# Patient Record
Sex: Female | Born: 1965 | Race: White | Hispanic: No | Marital: Single | Smoking: Former smoker
Health system: Southern US, Community
[De-identification: ages and names within clinical notes are randomized; demographics above are authoritative.]

## PROBLEM LIST (undated history)

## (undated) DIAGNOSIS — I10 Essential (primary) hypertension: Secondary | ICD-10-CM

## (undated) DIAGNOSIS — F32A Depression, unspecified: Secondary | ICD-10-CM

## (undated) DIAGNOSIS — F329 Major depressive disorder, single episode, unspecified: Secondary | ICD-10-CM

## (undated) DIAGNOSIS — E119 Type 2 diabetes mellitus without complications: Secondary | ICD-10-CM

## (undated) HISTORY — PX: BARIATRIC SURGERY: SHX1103

## (undated) HISTORY — PX: KNEE SURGERY: SHX244

## (undated) HISTORY — PX: GASTROPLASTY DUODENAL SWITCH: SHX1699

## (undated) HISTORY — PX: LAPAROSCOPIC BILATERAL SALPINGO OOPHERECTOMY: SHX5890

---

## 2013-07-26 ENCOUNTER — Ambulatory Visit: Payer: BC Managed Care – PPO | Admitting: Physical Therapy

## 2013-08-16 ENCOUNTER — Ambulatory Visit: Payer: BC Managed Care – PPO | Admitting: Physical Therapy

## 2014-08-17 ENCOUNTER — Emergency Department (HOSPITAL_BASED_OUTPATIENT_CLINIC_OR_DEPARTMENT_OTHER)
Admission: EM | Admit: 2014-08-17 | Discharge: 2014-08-17 | Disposition: A | Discharge status: Home / Self Care | Payer: 59 | Attending: Emergency Medicine | Admitting: Emergency Medicine

## 2014-08-17 ENCOUNTER — Emergency Department (HOSPITAL_BASED_OUTPATIENT_CLINIC_OR_DEPARTMENT_OTHER): Payer: 59

## 2014-08-17 ENCOUNTER — Encounter (HOSPITAL_BASED_OUTPATIENT_CLINIC_OR_DEPARTMENT_OTHER): Payer: Self-pay | Admitting: *Deleted

## 2014-08-17 DIAGNOSIS — Y9389 Activity, other specified: Secondary | ICD-10-CM | POA: Diagnosis not present

## 2014-08-17 DIAGNOSIS — Z79899 Other long term (current) drug therapy: Secondary | ICD-10-CM | POA: Diagnosis not present

## 2014-08-17 DIAGNOSIS — I10 Essential (primary) hypertension: Secondary | ICD-10-CM | POA: Diagnosis not present

## 2014-08-17 DIAGNOSIS — F329 Major depressive disorder, single episode, unspecified: Secondary | ICD-10-CM | POA: Insufficient documentation

## 2014-08-17 DIAGNOSIS — Y9289 Other specified places as the place of occurrence of the external cause: Secondary | ICD-10-CM | POA: Diagnosis not present

## 2014-08-17 DIAGNOSIS — S8002XA Contusion of left knee, initial encounter: Secondary | ICD-10-CM | POA: Diagnosis not present

## 2014-08-17 DIAGNOSIS — W010XXA Fall on same level from slipping, tripping and stumbling without subsequent striking against object, initial encounter: Secondary | ICD-10-CM | POA: Diagnosis not present

## 2014-08-17 DIAGNOSIS — Y998 Other external cause status: Secondary | ICD-10-CM | POA: Diagnosis not present

## 2014-08-17 DIAGNOSIS — E119 Type 2 diabetes mellitus without complications: Secondary | ICD-10-CM | POA: Diagnosis not present

## 2014-08-17 DIAGNOSIS — S8992XA Unspecified injury of left lower leg, initial encounter: Secondary | ICD-10-CM | POA: Diagnosis present

## 2014-08-17 HISTORY — DX: Type 2 diabetes mellitus without complications: E11.9

## 2014-08-17 HISTORY — DX: Depression, unspecified: F32.A

## 2014-08-17 HISTORY — DX: Essential (primary) hypertension: I10

## 2014-08-17 HISTORY — DX: Major depressive disorder, single episode, unspecified: F32.9

## 2014-08-17 MED ORDER — TRAMADOL HCL 50 MG PO TABS: 50.0000 mg | ORAL_TABLET | Freq: Four times a day (QID) | ORAL | Status: DC | PRN

## 2014-08-17 NOTE — ED Notes (Signed)
Pt tripped on a toy and landed on her left knee.

## 2014-08-17 NOTE — Discharge Instructions (Signed)
Contusion °A contusion is a deep bruise. Contusions are the result of an injury that caused bleeding under the skin. The contusion may turn blue, purple, or yellow. Minor injuries will give you a painless contusion, but more severe contusions may stay painful and swollen for a few weeks.  °CAUSES  °A contusion is usually caused by a blow, trauma, or direct force to an area of the body. °SYMPTOMS  °· Swelling and redness of the injured area. °· Bruising of the injured area. °· Tenderness and soreness of the injured area. °· Pain. °DIAGNOSIS  °The diagnosis can be made by taking a history and physical exam. An X-ray, CT scan, or MRI may be needed to determine if there were any associated injuries, such as fractures. °TREATMENT  °Specific treatment will depend on what area of the body was injured. In general, the best treatment for a contusion is resting, icing, elevating, and applying cold compresses to the injured area. Over-the-counter medicines may also be recommended for pain control. Ask your caregiver what the best treatment is for your contusion. °HOME CARE INSTRUCTIONS  °· Put ice on the injured area. °¨ Put ice in a plastic bag. °¨ Place a towel between your skin and the bag. °¨ Leave the ice on for 15-20 minutes, 3-4 times a day, or as directed by your health care provider. °· Only take over-the-counter or prescription medicines for pain, discomfort, or fever as directed by your caregiver. Your caregiver may recommend avoiding anti-inflammatory medicines (aspirin, ibuprofen, and naproxen) for 48 hours because these medicines may increase bruising. °· Rest the injured area. °· If possible, elevate the injured area to reduce swelling. °SEEK IMMEDIATE MEDICAL CARE IF:  °· You have increased bruising or swelling. °· You have pain that is getting worse. °· Your swelling or pain is not relieved with medicines. °MAKE SURE YOU:  °· Understand these instructions. °· Will watch your condition. °· Will get help right  away if you are not doing well or get worse. °Document Released: 10/10/2004 Document Revised: 01/05/2013 Document Reviewed: 11/05/2010 °Exit[employer]® Patient Information ©2015 [employer]. This information is not intended to replace advice given to you by your health care provider. Make sure you discuss any questions you have with your health care provider. ° °

## 2014-08-21 NOTE — ED Provider Notes (Signed)
CSN: [id number]     Arrival date & time 08/17/14  1453 History   First MD Initiated Contact with Patient 08/17/14 1459     Chief Complaint  Patient presents with  . Fall     (Consider location/radiation/quality/duration/timing/severity/associated sxs/prior Treatment) HPI   49 year old female with left knee pain. Patient mechanical fall earlier today when she tripped over a toy on the floor. She landed on her left knee. Persistent pain since then. She can ambulate although with increased pain. No numbness or tingling. Denies any significant pain anywhere else. No intervention prior to arrival.  Past Medical History  Diagnosis Date  . Diabetes mellitus without complication   . Hypertension   . Depression    Past Surgical History  Procedure Laterality Date  . Laparoscopic bilateral salpingo oopherectomy     No family history on file. History  Substance Use Topics  . Smoking status: Never Smoker   . Smokeless tobacco: Not on file  . Alcohol Use: Yes   OB History    No data available     Review of Systems  All systems reviewed and negative, other than as noted in HPI.   Allergies  Demerol; Ephedrine; and Zantac  Home Medications   Prior to Admission medications   Medication Sig Start Date End Date Taking? Authorizing Provider  buPROPion (WELLBUTRIN) 100 MG tablet Take 100 mg by mouth 2 (two) times daily.   Yes Historical Provider, MD  doxycycline (DORYX) 100 MG DR capsule Take 100 mg by mouth 2 (two) times daily.   Yes Historical Provider, MD  hydrochlorothiazide (HYDRODIURIL) 25 MG tablet Take 25 mg by mouth daily.   Yes Historical Provider, MD  losartan (COZAAR) 25 MG tablet Take 25 mg by mouth daily.   Yes Historical Provider, MD  metFORMIN (GLUCOPHAGE) 500 MG tablet Take by mouth 2 (two) times daily with a meal.   Yes Historical Provider, MD  omeprazole (PRILOSEC) 40 MG capsule Take 40 mg by mouth daily.   Yes Historical Provider, MD  propranolol (INDERAL) 20 MG  tablet Take 20 mg by mouth 3 (three) times daily.   Yes Historical Provider, MD  venlafaxine (EFFEXOR) 25 MG tablet Take 25 mg by mouth 2 (two) times daily.   Yes Historical Provider, MD   BP 141/63 mmHg  Pulse 70  Temp(Src) 98.4 F (36.9 C) (Oral)  Resp 18  Ht 5\' 9"  (1.753 m)  Wt 290 lb (131.543 kg)  BMI 42.81 kg/m2  SpO2 98% Physical Exam  Constitutional: She appears well-developed and well-nourished. No distress.  HENT:  Head: Normocephalic and atraumatic.  Eyes: Conjunctivae are normal. Right eye exhibits no discharge. Left eye exhibits no discharge.  Neck: Neck supple.  Cardiovascular: Normal rate, regular rhythm and normal heart sounds.  Exam reveals no gallop and no friction rub.   No murmur heard. Pulmonary/Chest: Effort normal and breath sounds normal. No respiratory distress.  Abdominal: Soft. She exhibits no distension. There is no tenderness.  Musculoskeletal: She exhibits no edema or tenderness.  Mild swelling of the left knee. Mild tenderness to palpation over the proximal/lateral tibia/fibula. No effusion. No concerning skin changes. Able to fully range contrast resistance. Neurovascular intact distally. No pain with range of motion of the hip.  Neurological: She is alert.  Skin: Skin is warm and dry.  Psychiatric: She has a normal mood and affect. Her behavior is normal. Thought content normal.  Nursing note and vitals reviewed.   ED Course  Procedures (including critical care time) Labs  Review Labs Reviewed - No data to display  Imaging Review No results found.   EKG Interpretation None      MDM   Final diagnoses:  Knee contusion, left, initial encounter    49 year old female with knee pain after fall. Negative imaging. Plan symptomatic treatment. Closed injury. Neurovascularly intact.    Raeford Razor, MD 08/21/14 [phone]

## 2015-01-05 ENCOUNTER — Emergency Department (HOSPITAL_BASED_OUTPATIENT_CLINIC_OR_DEPARTMENT_OTHER)
Admission: EM | Admit: 2015-01-05 | Discharge: 2015-01-05 | Disposition: A | Discharge status: Home / Self Care | Payer: 59 | Attending: Emergency Medicine | Admitting: Emergency Medicine

## 2015-01-05 ENCOUNTER — Encounter (HOSPITAL_BASED_OUTPATIENT_CLINIC_OR_DEPARTMENT_OTHER): Payer: Self-pay

## 2015-01-05 DIAGNOSIS — Z79899 Other long term (current) drug therapy: Secondary | ICD-10-CM | POA: Diagnosis not present

## 2015-01-05 DIAGNOSIS — I10 Essential (primary) hypertension: Secondary | ICD-10-CM | POA: Insufficient documentation

## 2015-01-05 DIAGNOSIS — Z792 Long term (current) use of antibiotics: Secondary | ICD-10-CM | POA: Insufficient documentation

## 2015-01-05 DIAGNOSIS — E119 Type 2 diabetes mellitus without complications: Secondary | ICD-10-CM | POA: Diagnosis not present

## 2015-01-05 DIAGNOSIS — Z7984 Long term (current) use of oral hypoglycemic drugs: Secondary | ICD-10-CM | POA: Insufficient documentation

## 2015-01-05 DIAGNOSIS — F329 Major depressive disorder, single episode, unspecified: Secondary | ICD-10-CM | POA: Diagnosis not present

## 2015-01-05 DIAGNOSIS — M25561 Pain in right knee: Secondary | ICD-10-CM | POA: Diagnosis present

## 2015-01-05 NOTE — ED Provider Notes (Signed)
CSN: [id number]     Arrival date & time 01/05/15  2037 History  By signing my name below, I, Gonzella Lex, attest that this documentation has been prepared under the direction and in the presence of Paula Libra, MD. Electronically Signed: Gonzella Lex, Scribe. 01/05/2015. 11:13 PM.    Chief Complaint  Patient presents with  . Knee Pain    The history is provided by the patient. No language interpreter was used.    HPI Comments: Susan Johns is a 49 y.o. female who presents to the Emergency Department complaining of right knee pain onset three days ago. There was no precipitating trauma. Pt reports difficulty with ambulation today and went to see Dr. Thurston Hole who administered a cortisone injection and prescribed her tramadol and meloxicam. About an hour after the injection she developed severe pain in the right knee. She localizes the pain to the entire knee. Pain is worse with flexion or weightbearing. She's got no relief from the tramadol and meloxicam. She reports associated swelling of the right knee.    Past Medical History  Diagnosis Date  . Diabetes mellitus without complication (HCC)   . Hypertension   . Depression    Past Surgical History  Procedure Laterality Date  . Laparoscopic bilateral salpingo oopherectomy     No family history on file. Social History  Substance Use Topics  . Smoking status: Never Smoker   . Smokeless tobacco: None  . Alcohol Use: Yes   OB History    No data available     Review of Systems A complete 10 system review of systems was obtained and all systems are negative except as noted in the HPI and PMH.   Allergies  Demerol; Ephedrine; Hydrocodone; Oxycodone; and Zantac  Home Medications   Prior to Admission medications   Medication Sig Start Date End Date Taking? Authorizing Provider  buPROPion (WELLBUTRIN) 100 MG tablet Take 100 mg by mouth 2 (two) times daily.    Historical Provider, MD  doxycycline (DORYX) 100 MG DR  capsule Take 100 mg by mouth 2 (two) times daily.    Historical Provider, MD  hydrochlorothiazide (HYDRODIURIL) 25 MG tablet Take 25 mg by mouth daily.    Historical Provider, MD  losartan (COZAAR) 25 MG tablet Take 25 mg by mouth daily.    Historical Provider, MD  metFORMIN (GLUCOPHAGE) 500 MG tablet Take by mouth 2 (two) times daily with a meal.    Historical Provider, MD  omeprazole (PRILOSEC) 40 MG capsule Take 40 mg by mouth daily.    Historical Provider, MD  propranolol (INDERAL) 20 MG tablet Take 20 mg by mouth 3 (three) times daily.    Historical Provider, MD  venlafaxine (EFFEXOR) 25 MG tablet Take 25 mg by mouth 2 (two) times daily.    Historical Provider, MD   BP 169/87 mmHg  Pulse 78  Temp(Src) 99.5 F (37.5 C) (Oral)  Resp 18  Ht  (1.753 m)  Wt 302 lb (136.986 kg)  BMI 44.58 kg/m2  SpO2 97%   Physical Exam General: Well-developed, well-nourished female in no acute distress; appearance consistent with age of record HENT: normocephalic; atraumatic Eyes: pupils equal, round and reactive to light; extraocular muscles intact Neck: supple Heart: regular rate and rhythm Lungs: clear to auscultation bilaterally Abdomen: soft; nondistended Extremities: No deformity; full range of motion except right knee limited by pain; pulses normal; mild tenderness of medial right knee with severe pain on flexion, small effusion noted Neurologic: Awake, alert and  oriented; motor function intact in all extremities and symmetric; no facial droop Skin: Warm and dry Psychiatric: Normal mood and affect   ED Course  Procedures  DIAGNOSTIC STUDIES:    Oxygen Saturation is 97% on RA, adequate by my interpretation.   COORDINATION OF CARE:  10:59 PM Will discharge pt with immobilizer and crutches. Discussed treatment plan with pt at bedside and pt agreed to plan.   MDM   Final diagnoses:  Acute knee pain, right   I personally performed the services described in this documentation,  which was scribed in my presence. The recorded information has been reviewed and is accurate.    Paula LibraJohn Sayler Mickiewicz, MD 01/05/15 [phone]

## 2015-01-05 NOTE — ED Notes (Signed)
Pt c/o right knee pain without injury, saw ortho today and had xrays which were negative, given cortisone shot, tramadol and meloxicam and states the pain got much worse on the way home.  Slight swelling to knee, was given knee sleeve as well but pt states that it is not helping.  Pt walking with aid of a walker.

## 2015-04-13 ENCOUNTER — Encounter (HOSPITAL_COMMUNITY): Payer: Self-pay

## 2015-04-13 ENCOUNTER — Other Ambulatory Visit (HOSPITAL_COMMUNITY): Payer: Self-pay | Admitting: Orthopedic Surgery

## 2015-04-13 DIAGNOSIS — M25561 Pain in right knee: Principal | ICD-10-CM

## 2015-04-13 DIAGNOSIS — M25461 Effusion, right knee: Secondary | ICD-10-CM

## 2015-04-14 ENCOUNTER — Ambulatory Visit (HOSPITAL_COMMUNITY)
Admission: RE | Admit: 2015-04-14 | Discharge: 2015-04-14 | Disposition: A | Discharge status: Home / Self Care | Payer: BLUE CROSS/BLUE SHIELD | Source: Ambulatory Visit | Attending: Cardiology | Admitting: Cardiology

## 2015-04-14 DIAGNOSIS — F329 Major depressive disorder, single episode, unspecified: Secondary | ICD-10-CM | POA: Insufficient documentation

## 2015-04-14 DIAGNOSIS — I10 Essential (primary) hypertension: Secondary | ICD-10-CM | POA: Insufficient documentation

## 2015-04-14 DIAGNOSIS — M25461 Effusion, right knee: Secondary | ICD-10-CM | POA: Insufficient documentation

## 2015-04-14 DIAGNOSIS — M25561 Pain in right knee: Secondary | ICD-10-CM | POA: Diagnosis not present

## 2015-04-14 DIAGNOSIS — E119 Type 2 diabetes mellitus without complications: Secondary | ICD-10-CM | POA: Insufficient documentation

## 2015-04-17 ENCOUNTER — Encounter (HOSPITAL_COMMUNITY): Payer: Self-pay

## 2015-12-01 ENCOUNTER — Emergency Department (HOSPITAL_BASED_OUTPATIENT_CLINIC_OR_DEPARTMENT_OTHER)
Admission: EM | Admit: 2015-12-01 | Discharge: 2015-12-01 | Disposition: A | Discharge status: Home / Self Care | Payer: BLUE CROSS/BLUE SHIELD | Attending: Emergency Medicine | Admitting: Emergency Medicine

## 2015-12-01 ENCOUNTER — Encounter (HOSPITAL_BASED_OUTPATIENT_CLINIC_OR_DEPARTMENT_OTHER): Payer: Self-pay | Admitting: *Deleted

## 2015-12-01 DIAGNOSIS — I10 Essential (primary) hypertension: Secondary | ICD-10-CM | POA: Insufficient documentation

## 2015-12-01 DIAGNOSIS — Y9389 Activity, other specified: Secondary | ICD-10-CM | POA: Insufficient documentation

## 2015-12-01 DIAGNOSIS — Z79899 Other long term (current) drug therapy: Secondary | ICD-10-CM | POA: Insufficient documentation

## 2015-12-01 DIAGNOSIS — Y929 Unspecified place or not applicable: Secondary | ICD-10-CM | POA: Insufficient documentation

## 2015-12-01 DIAGNOSIS — Z7984 Long term (current) use of oral hypoglycemic drugs: Secondary | ICD-10-CM | POA: Insufficient documentation

## 2015-12-01 DIAGNOSIS — S61210A Laceration without foreign body of right index finger without damage to nail, initial encounter: Secondary | ICD-10-CM | POA: Insufficient documentation

## 2015-12-01 DIAGNOSIS — Y999 Unspecified external cause status: Secondary | ICD-10-CM | POA: Insufficient documentation

## 2015-12-01 DIAGNOSIS — E119 Type 2 diabetes mellitus without complications: Secondary | ICD-10-CM | POA: Insufficient documentation

## 2015-12-01 DIAGNOSIS — W268XXA Contact with other sharp object(s), not elsewhere classified, initial encounter: Secondary | ICD-10-CM | POA: Insufficient documentation

## 2015-12-01 MED ORDER — CEPHALEXIN 500 MG PO CAPS: 500.0000 mg | ORAL_CAPSULE | Freq: Four times a day (QID) | ORAL | 0 refills | Status: DC

## 2015-12-01 MED ORDER — LIDOCAINE HCL (PF) 1 % IJ SOLN
5.0000 mL | Freq: Once | INTRAMUSCULAR | Status: AC
  Administered 2015-12-01: 5 mL via INTRADERMAL
  Filled 2015-12-01: qty 5

## 2015-12-01 NOTE — ED Provider Notes (Signed)
[facility] Provider Note   CSN: [id number] Arrival date & time: 12/01/15  1935   By signing my name below, I, Soijett Blue, attest that this documentation has been prepared under the direction and in the presence of Dierdre Forth, PA-C Electronically Signed: Soijett Blue, ED Scribe. 12/01/15. 9:06 PM.  History   Chief Complaint Chief Complaint  Patient presents with  . Laceration    HPI Susan Johns is a 50 y.o. female with a PMHx of DM, HTN, who presents to the Emergency Department complaining of right index finger laceration onset PTA. She reports that she cut her right index finger on a metal can lid while taking out the trash. Pt notes that she is UTD with her tetanus vaccination with her last being less than 5 years ago. Pt reports that she is having associated symptoms of mild right index pain. She states that she has tried applying pressure without medications for the relief for her symptoms. She denies numbness, tingling, color change, swelling, and any other symptoms. Pt denies taking insulin for her DM. Pt notes that she has a PCP. 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     The history is provided by the patient. No language interpreter was used.    Past Medical History:  Diagnosis Date  . Depression   . Diabetes mellitus without complication (HCC)   . Hypertension     There are no active problems to display for this patient.   Past Surgical History:  Procedure Laterality Date  . KNEE SURGERY    . LAPAROSCOPIC BILATERAL SALPINGO OOPHERECTOMY      OB History    No data available       Home Medications    Prior to Admission medications   Medication Sig Start Date End Date Taking? Authorizing Provider  GLIPIZIDE PO Take by mouth.   Yes Historical Provider, MD  losartan (COZAAR) 25 MG tablet Take 25 mg by mouth daily.   Yes Historical Provider, MD  omeprazole (PRILOSEC) 40 MG capsule Take 40 mg by mouth  daily.   Yes Historical Provider, MD  Pioglitazone HCl (ACTOS PO) Take by mouth.   Yes Historical Provider, MD  propranolol (INDERAL) 20 MG tablet Take 20 mg by mouth 3 (three) times daily.   Yes Historical Provider, MD  venlafaxine (EFFEXOR) 25 MG tablet Take 25 mg by mouth 2 (two) times daily.   Yes Historical Provider, MD  buPROPion (WELLBUTRIN) 100 MG tablet Take 100 mg by mouth 2 (two) times daily.    Historical Provider, MD  cephALEXin (KEFLEX) 500 MG capsule Take 1 capsule (500 mg total) by mouth 4 (four) times daily. 12/01/15   Quinlan Vollmer, PA-C  doxycycline (DORYX) 100 MG DR capsule Take 100 mg by mouth 2 (two) times daily.    Historical Provider, MD  hydrochlorothiazide (HYDRODIURIL) 25 MG tablet Take 25 mg by mouth daily.    Historical Provider, MD  metFORMIN (GLUCOPHAGE) 500 MG tablet Take by mouth 2 (two) times daily with a meal.    Historical Provider, MD    Family History No family history on file.  Social History Social History  Substance Use Topics  . Smoking status: Never Smoker  . Smokeless tobacco: Never Used  . Alcohol use Yes     Allergies   Demerol [meperidine]; Ephedrine; Hydrocodone; Oxycodone; and Zantac [ranitidine hcl]   Review of Systems Review of Systems  Musculoskeletal: Positive for arthralgias (right index finger). Negative  for joint swelling.  Skin: Positive for wound (laceration to right index finger). Negative for color change.  Neurological: Negative for numbness.       No tingling  All other systems reviewed and are negative.    Physical Exam Updated Vital Signs BP 132/81 (BP Location: Left Arm)   Pulse 84   Temp 98.5 F (36.9 C) (Oral)   Resp 18   Ht 5\' 9"  (1.753 m)   Wt (!) 305 lb (138.3 kg)   SpO2 96%   BMI 45.04 kg/m   Physical Exam  Constitutional: She is oriented to person, place, and time. She appears well-developed and well-nourished. No distress.  HENT:  Head: Normocephalic and atraumatic.  Eyes: Conjunctivae  are normal. No scleral icterus.  Neck: Normal range of motion.  Cardiovascular: Normal rate, regular rhythm, normal heart sounds and intact distal pulses.   No murmur heard. Capillary refill < 3 sec  Pulmonary/Chest: Effort normal and breath sounds normal. No respiratory distress.  Musculoskeletal: Normal range of motion. She exhibits no edema.  ROM: full ROM of the right pointer finger with 1.7 cm laceration that doesn't involve the cuticle or nail bed.   Neurological: She is alert and oriented to person, place, and time. She has normal strength. No sensory deficit.  Sensation: intact in right pointer finger Strength: 5/5 with flexion and extension of right pointer finger  Skin: Skin is warm and dry. She is not diaphoretic.  Psychiatric: She has a normal mood and affect.  Nursing note and vitals reviewed.    ED Treatments / Results  DIAGNOSTIC STUDIES: Oxygen Saturation is 96% on RA, nl by my interpretation.    COORDINATION OF CARE: 9:05 PM Discussed treatment plan with pt at bedside which includes laceration repair, follow up in 5-7 days with PCP for suture removal, and abx Rx, and pt agreed to plan.   Procedures .Marland Kitchen.Laceration Repair Date/Time: 12/01/2015 8:46 PM Performed by: Dierdre ForthMUTHERSBAUGH, Toba Claudio Authorized by: Dierdre ForthMUTHERSBAUGH, Uchechukwu Dhawan   Consent:    Consent obtained:  Verbal   Consent given by:  Patient   Risks discussed:  Pain and need for additional repair Anesthesia (see MAR for exact dosages):    Anesthesia method:  Local infiltration   Local anesthetic:  Lidocaine 1% w/o epi (2.5 cc used) Laceration details:    Location:  Finger   Finger location:  R index finger   Length (cm):  1.7 Repair type:    Repair type:  Simple Pre-procedure details:    Preparation:  Patient was prepped and draped in usual sterile fashion Exploration:    Hemostasis achieved with:  Direct pressure   Wound exploration: entire depth of wound probed and visualized   Treatment:    Area cleansed  with:  Betadine and saline   Amount of cleaning:  Standard   Irrigation solution:  Sterile saline   Irrigation method:  Pressure wash   Visualized foreign bodies/material removed: no   Skin repair:    Repair method:  Sutures   Suture size:  5-0   Suture material:  Prolene   Suture technique:  Simple interrupted   Number of sutures:  3 Approximation:    Approximation:  Close Post-procedure details:    Dressing:  Sterile dressing   Patient tolerance of procedure:  Tolerated well, no immediate complications     (including critical care time)  Medications Ordered in ED Medications  lidocaine (PF) (XYLOCAINE) 1 % injection 5 mL (5 mLs Intradermal Given 12/01/15 2135)     Initial  Impression / Assessment and Plan / ED Course  I have reviewed the triage vital signs and the nursing notes.   Clinical Course     Pressure irrigation performed. Wound explored and base of wound visualized in a bloodless field without evidence of foreign body.  Laceration occurred < 8 hours prior to repair which was well tolerated. Tdap up to date.  Pt has comorbidities to effect normal wound healing. Pt discharged with antibiotics.  Discussed suture home care with patient and answered questions. Pt to follow-up for wound check and suture removal in 7 days; they are to return to the ED sooner for signs of infection. Pt is hemodynamically stable with no complaints prior to dc.    Final Clinical Impressions(s) / ED Diagnoses   Final diagnoses:  Laceration of right index finger without foreign body without damage to nail, initial encounter    New Prescriptions Discharge Medication List as of 12/01/2015  9:17 PM    START taking these medications   Details  cephALEXin (KEFLEX) 500 MG capsule Take 1 capsule (500 mg total) by mouth 4 (four) times daily., Starting Fri 12/01/2015, Print       I personally performed the services described in this documentation, which was scribed in my presence. The  recorded information has been reviewed and is accurate.     Dahlia ClientHannah Rayan Dyal, PA-C 12/01/15 [phone]    Rolan BuccoMelanie Belfi, MD 12/01/15 [phone]

## 2015-12-01 NOTE — Discharge Instructions (Signed)

## 2015-12-01 NOTE — ED Triage Notes (Signed)
Laceration to her right 2nd finger on a metal can lid.

## 2017-01-02 ENCOUNTER — Encounter (HOSPITAL_BASED_OUTPATIENT_CLINIC_OR_DEPARTMENT_OTHER): Payer: Self-pay

## 2017-01-02 ENCOUNTER — Emergency Department (HOSPITAL_BASED_OUTPATIENT_CLINIC_OR_DEPARTMENT_OTHER): Payer: BLUE CROSS/BLUE SHIELD

## 2017-01-02 ENCOUNTER — Other Ambulatory Visit: Payer: Self-pay

## 2017-01-02 ENCOUNTER — Emergency Department (HOSPITAL_BASED_OUTPATIENT_CLINIC_OR_DEPARTMENT_OTHER)
Admission: EM | Admit: 2017-01-02 | Discharge: 2017-01-02 | Disposition: A | Discharge status: Home / Self Care | Payer: BLUE CROSS/BLUE SHIELD | Attending: Emergency Medicine | Admitting: Emergency Medicine

## 2017-01-02 DIAGNOSIS — W010XXA Fall on same level from slipping, tripping and stumbling without subsequent striking against object, initial encounter: Secondary | ICD-10-CM | POA: Insufficient documentation

## 2017-01-02 DIAGNOSIS — I10 Essential (primary) hypertension: Secondary | ICD-10-CM | POA: Insufficient documentation

## 2017-01-02 DIAGNOSIS — Y999 Unspecified external cause status: Secondary | ICD-10-CM | POA: Diagnosis not present

## 2017-01-02 DIAGNOSIS — Z87891 Personal history of nicotine dependence: Secondary | ICD-10-CM | POA: Diagnosis not present

## 2017-01-02 DIAGNOSIS — Y92009 Unspecified place in unspecified non-institutional (private) residence as the place of occurrence of the external cause: Secondary | ICD-10-CM | POA: Insufficient documentation

## 2017-01-02 DIAGNOSIS — S59901A Unspecified injury of right elbow, initial encounter: Secondary | ICD-10-CM | POA: Diagnosis present

## 2017-01-02 DIAGNOSIS — E119 Type 2 diabetes mellitus without complications: Secondary | ICD-10-CM | POA: Insufficient documentation

## 2017-01-02 DIAGNOSIS — Z79899 Other long term (current) drug therapy: Secondary | ICD-10-CM | POA: Insufficient documentation

## 2017-01-02 DIAGNOSIS — S52134A Nondisplaced fracture of neck of right radius, initial encounter for closed fracture: Secondary | ICD-10-CM | POA: Diagnosis not present

## 2017-01-02 DIAGNOSIS — Y9301 Activity, walking, marching and hiking: Secondary | ICD-10-CM | POA: Diagnosis not present

## 2017-01-02 DIAGNOSIS — W19XXXA Unspecified fall, initial encounter: Secondary | ICD-10-CM

## 2017-01-02 NOTE — ED Triage Notes (Signed)
Pt states she slipped/fell approx 930pm-pain to right elbow-minor pain to right hip and UE-NAD-steady gait

## 2017-01-02 NOTE — ED Provider Notes (Signed)
[facility] EMERGENCY DEPARTMENT Provider Note   CSN: [id number] Arrival date & time: 01/02/17  1106     History   Chief Complaint Chief Complaint  Patient presents with  . Fall    HPI Susan Johns is a 51 y.o. female.  The history is provided by the patient. No language interpreter was used.  Fall     Susan Johns is a 51 y.o. female who presents to the Emergency Department complaining of fall.  Urgency department for evaluation of injuries following a mechanical fall that happened earlier today.  She was walking at home when she tripped over a rug and slid forward, landing on her right side with her right elbow underneath her.  She reports pain to her right elbow that radiates to her wrist with movement.  She is right-hand dominant.  She did have gastric surgery 3 weeks ago.  No abdominal pain, chest pain.  Past Medical History:  Diagnosis Date  . Depression   . Diabetes mellitus without complication (HCC)   . Hypertension     There are no active problems to display for this patient.   Past Surgical History:  Procedure Laterality Date  . BARIATRIC SURGERY    . GASTROPLASTY DUODENAL SWITCH    . KNEE SURGERY    . LAPAROSCOPIC BILATERAL SALPINGO OOPHERECTOMY      OB History    No data available       Home Medications    Prior to Admission medications   Medication Sig Start Date End Date Taking? Authorizing Provider  losartan (COZAAR) 25 MG tablet Take 25 mg by mouth daily.    [provider]  omeprazole (PRILOSEC) 40 MG capsule Take 40 mg by mouth daily.    [provider]  propranolol (INDERAL) 20 MG tablet Take 20 mg by mouth 3 (three) times daily.    [provider]  venlafaxine (EFFEXOR) 25 MG tablet Take 25 mg by mouth 2 (two) times daily.    [provider]    Family History No family history on file.  Social History Social History   Tobacco Use  . Smoking status: Former Games developermoker  . Smokeless tobacco:  Never Used  Substance Use Topics  . Alcohol use: No    Frequency: Never  . Drug use: No     Allergies   Demerol [meperidine]; Ephedrine; Hydrocodone; Oxycodone; and Zantac [ranitidine hcl]   Review of Systems Review of Systems  All other systems reviewed and are negative.    Physical Exam Updated Vital Signs BP (!) 113/53 (BP Location: Left Arm)   Pulse 65   Temp 98.9 F (37.2 C) (Oral)   Resp 18   Ht 5\' 8"  (1.727 m)   Wt 117.9 kg (260 lb)   SpO2 100%   BMI 39.53 kg/m   Physical Exam  Constitutional: She is oriented to person, place, and time. She appears well-developed and well-nourished.  HENT:  Head: Normocephalic and atraumatic.  Cardiovascular: Normal rate and regular rhythm.  No murmur heard. Pulmonary/Chest: Effort normal and breath sounds normal. No respiratory distress.  Abdominal: Soft. There is no tenderness. There is no rebound and no guarding.  Musculoskeletal:  2+ radial pulses bilaterally.  There is tenderness to palpation throughout the right elbow with no palpable deformity.  5 out of 5 grip strength bilaterally.  Sensation to light touch intact throughout the digits of the hand.  Neurological: She is alert and oriented to person, place, and time.  Skin: Skin is  warm and dry.  Psychiatric: She has a normal mood and affect. Her behavior is normal.  Nursing note and vitals reviewed.    ED Treatments / Results  Labs (all labs ordered are listed, but only abnormal results are displayed) Labs Reviewed - No data to display  EKG  EKG Interpretation None       Radiology Dg Elbow Complete Right  Result Date: 01/02/2017 CLINICAL DATA:  Right elbow injury due to a fall today. Pain. Initial encounter. EXAM: RIGHT ELBOW - COMPLETE 3+ VIEW COMPARISON:  None. FINDINGS: The patient has a nondisplaced fracture of the right radial neck. No other fracture is identified. Associated joint effusion is noted. The elbow is located. Mild spurring off the medial  epicondyle incidentally noted. IMPRESSION: Nondisplaced fracture of the radial neck with associated joint effusion. Electronically Signed   By: Drusilla Kannerhomas  Dalessio M.D.   On: 01/02/2017 12:13    Procedures Procedures (including critical care time)  Medications Ordered in ED Medications - No data to display   Initial Impression / Assessment and Plan / ED Course  I have reviewed the triage vital signs and the nursing notes.  Pertinent labs & imaging results that were available during my care of the patient were reviewed by me and considered in my medical decision making (see chart for details).     Patient here for evaluation of injuries following a mechanical fall.  She does have tenderness to palpation throughout the elbow but is neurovascularly intact.  Imaging demonstrates a radial neck fracture.  Will place in splint for comfort with close orthopedic follow-up.  She is status post gastric bypass surgery 3 weeks ago and has oral hydrocodone elixir if she does have pain.  Discussed that she may take Tylenol if she does not need the stronger hydrocodone.  Final Clinical Impressions(s) / ED Diagnoses   Final diagnoses:  Fall, initial encounter  Closed nondisplaced fracture of neck of right radius, initial encounter    ED Discharge Orders    None       Tilden Fossaees, Shrinika Blatz, MD 01/02/17 1240

## 2017-12-18 ENCOUNTER — Ambulatory Visit: Payer: BLUE CROSS/BLUE SHIELD | Attending: Orthopaedic Surgery | Admitting: Physical Therapy

## 2017-12-18 ENCOUNTER — Other Ambulatory Visit: Payer: Self-pay

## 2017-12-18 DIAGNOSIS — M533 Sacrococcygeal disorders, not elsewhere classified: Secondary | ICD-10-CM | POA: Insufficient documentation

## 2017-12-18 NOTE — Patient Instructions (Signed)
Cat / Cow Flow    Inhale, press spine toward ceiling like a Halloween cat. Keeping strength in arms and abdominals, exhale to soften spine through neutral and into cow pose. Open chest and arch back. Initiate movement between cat and cow at tailbone, one vertebrae at a time. Repeat __10-20__ times.   KNEE: Quadriceps - Prone    Place strap around ankle. Bring ankle toward buttocks. Press hip into surface. Hold _30-60__ seconds. _2-3__ reps per set, ___ sets per day, _7 __ days per week   Copyright  VHI. All rights reserved.   Lower abdominal/core stability exercises  1. Practice your breathing technique: Inhale through your nose expanding your belly and rib cage. Try not to breathe into your chest. Exhale slowly and gradually out your mouth feeling a sense of softness to your body. Practice multiple times. This can be performed unlimited.  2. Finding the lower abdominals. Laying on your back with the knees bent, place your fingers just below your belly button. Using your breathing technique from above, on your exhale gently pull the belly button away from your fingertips without tensing any other muscles. Practice this 5x. Next, as you exhale, draw belly button inwards and hold onto it...then feel as if you are pulling that muscle across your pelvis like you are tightening a belt. This can be hard to do at first so be patient and practice. Do 5-10 reps 1-3 x day. Always recognize quality over quantity; if your abdominal muscles become tired you will notice you may tighten/contract other muscles. This is the time to take a break.   Practice this first laying on your back, then in sitting, progressing to standing and finally adding it to all your daily movements.   3. Finding your pelvic floor. Using the breathing technique above, when your exhale, this time draw your pelvic floor muscles up as if you were attempting to stop the flow of urination. Be careful NOT to tense any other muscles.  This can be hard, BE PATIENT. Try to hold up to 10 seconds repeating 10x. Try 2x a day. Once you feel you are doing this well, add this contraction to exercise #2. First contracting your pelvic floor followed by lower abdominals.    Solon PalmJulie Liona Wengert, PT 12/18/17 1:46 PM [facility] [address] [place], [place], [address] Phone: [phone]   Fax:  [phone]

## 2017-12-18 NOTE — Therapy (Signed)
[facility] [address] [place], [place], [address] Phone: [phone]   Fax:  [phone]  Physical Therapy Evaluation  Patient Details  Name: Malaysha Arlen MRN: [id number] Date of Birth: April 02, 1965 Referring Provider (PT): Ramond Marrow   Encounter Date: 12/18/2017  PT End of Session - 12/18/17 1302    Visit Number  1    Date for PT Re-Evaluation  02/12/18    PT Start Time  1302    PT Stop Time  1347    PT Time Calculation (min)  45 min    Activity Tolerance  Patient tolerated treatment well    Behavior During Therapy  [facility] for tasks assessed/performed       Past Medical History:  Diagnosis Date  . Depression   . Diabetes mellitus without complication (HCC)   . Hypertension     Past Surgical History:  Procedure Laterality Date  . BARIATRIC SURGERY    . GASTROPLASTY DUODENAL SWITCH    . KNEE SURGERY    . LAPAROSCOPIC BILATERAL SALPINGO OOPHERECTOMY      There were no vitals filed for this visit.   Subjective Assessment - 12/18/17 1305    Subjective  Patient fell on Right side Dec 2018 and fractured her right elbow. Patient reports insidious onset of sacral pain beginning in July 2019. Since Sep/Oct it kind of went away. Six weeks ago fell on her left side when a dog pulled her down. Since then her tailbone pain has increased and now both hips hurt.  she has lost 117 pounds since her bariatric surgery in Nov 2018. She has seen a chiropractor in July/August and had some relief. Pain with standing and walking and feels tightness into ant thigh.     Pertinent History  R knee bil meniscus repair, bariatric surgery, HTN, depression, R elbow fx    How long can you sit comfortably?  minutes    Diagnostic tests  xrays back and hips negative    Patient Stated Goals  get rid of pain    Currently in Pain?  Yes    Pain Score  5     Pain Location  Sacrum    Pain Orientation  Mid    Pain Descriptors / Indicators   Aching;Dull;Sharp    Pain Type  Chronic pain    Pain Radiating Towards  into bil hips intermittently    Pain Onset  More than a month ago    Pain Frequency  Intermittent    Aggravating Factors   sit to stand and sitting; lying sometimes    Pain Relieving Factors  sitting and leaning forward at the same time    Effect of Pain on Daily Activities  can't sit          [facility] PT Assessment - 12/18/17 0001      Assessment   Medical Diagnosis  lumbago; coccyx pain    Referring Provider (PT)  Ramond Marrow    Onset Date/Surgical Date  11/06/17    Next MD Visit  after therapy    Prior Therapy  no      Precautions   Precautions  None      Restrictions   Weight Bearing Restrictions  No      Balance Screen   Has the patient fallen in the past 6 months  Yes    How many times?  1    Has the patient had a decrease in activity level because of a  fear of falling?   No    Is the patient reluctant to leave their home because of a fear of falling?   No      Home Public house manager residence    Living Arrangements  Spouse/significant other    Additional Comments  couple of steps into home; hurts hips      Prior Function   Level of Independence  Independent    Vocation  Part time employment    Vocation Requirements  pet sitting, dog walking      Observation/Other Assessments   Focus on Therapeutic Outcomes (FOTO)   unable to do due to computer freezing      Posture/Postural Control   Posture/Postural Control  Postural limitations    Postural Limitations  Decreased lumbar lordosis    Posture Comments  tight Right QL      ROM / Strength   AROM / PROM / Strength  AROM;Strength      AROM   Overall AROM Comments  Lumbar WNL except extension not much movement in lower lumbar; tighness reported with SB      Strength   Overall Strength Comments  grossly 5/5 in BLE except R hip ext 4-/5; weak Transverse abdominals      Flexibility   Soft Tissue Assessment /Muscle  Length  yes    Hamstrings  WNL    Quadriceps  bil tightness    ITB  WNL    Piriformis  WNL    Quadratus Lumborum  tight bil      Palpation   Spinal mobility  decreased PA mobility lumbar; normal nutation/counternutation with no pain reported      Special Tests   Other special tests  sacral instability on R with left SLR; pain on R with prone R SLR; neg hip scour bil, neg sacral thrust bil                Objective measurements completed on examination: See above findings.              PT Education - 12/18/17 1541    Education Details  HEP    Person(s) Educated  Patient    Methods  Explanation;Demonstration;Handout    Comprehension  Verbalized understanding;Returned demonstration       PT Short Term Goals - 12/18/17 1554      PT SHORT TERM GOAL #1   Title  Ind with initial HEP    Time  2    Period  Weeks    Status  New    Target Date  01/01/18        PT Long Term Goals - 12/18/17 1555      PT LONG TERM GOAL #1   Title  decreased pain with sitting by 75% or more    Time  8    Period  Weeks    Status  New    Target Date  02/12/18      PT LONG TERM GOAL #2   Title  Patient to demo negative lumbosaral instability and pain with SLR.    Time  8    Period  Weeks    Status  New      PT LONG TERM GOAL #3   Title  Patient able to sit for 30 min without having to bend forward.     Time  8    Period  Weeks    Status  New  Plan - 12/18/17 1541    Clinical Impression Statement  Patient presents with a six month h/o coccyx pain and hip pain mainly with sitting. She has a h/o of two falls which may have contributed however xrays are negative. She has decreased PA mobility in the lumbar spine and normal sacral mobility without pain until she sits. She gets relief with seated flexion. She has normal to hyperflexibility except in bil quads and has mild LE strength deficits but weak abdominals. She has lost over 100# since bariatric surgery  Nov 2018.     History and Personal Factors relevant to plan of care:  depression    Clinical Presentation  Evolving    Clinical Presentation due to:  worsening sx    Clinical Decision Making  Low    Rehab Potential  Good    PT Frequency  2x / week    PT Duration  8 weeks    PT Treatment/Interventions  ADLs/Self Care Home Management;Cryotherapy;Electrical Stimulation;Moist Heat;Traction;Therapeutic exercise;Neuromuscular re-education;Patient/family education;Manual techniques;Dry needling;Taping    PT Next Visit Plan  work on lumbar mobility, sacral stability and core strength. Maybe traction?    PT Home Exercise Plan  cat/cow, standing "7" stretch at sink, prone quad stretch with strap; TA contraction using pelvic floor    Consulted and Agree with Plan of Care  Patient       Patient will benefit from skilled therapeutic intervention in order to improve the following deficits and impairments:  Pain, Decreased mobility, Postural dysfunction, Decreased activity tolerance, Decreased strength  Visit Diagnosis: Sacrococcygeal disorders, not elsewhere classified - Plan: PT plan of care cert/re-cert     Problem List There are no active problems to display for this patient.   Raynelle FanningJulie Jaxson Anglin PT 12/18/2017, 4:03 PM  [facility] [address] [place], [place], [address] Phone: [phone]   Fax:  [phone]  Name: Lavone NeriSusan Cohen MRN: [id number] Date of Birth: 11/19/1965

## 2017-12-22 ENCOUNTER — Ambulatory Visit: Payer: BLUE CROSS/BLUE SHIELD | Admitting: Physical Therapy

## 2017-12-22 DIAGNOSIS — M533 Sacrococcygeal disorders, not elsewhere classified: Secondary | ICD-10-CM

## 2017-12-22 NOTE — Therapy (Signed)
[place] [address] [place], [place], [address] Phone: [phone]   Fax:  [phone]  Physical Therapy Treatment  Patient Details  Name: Susan Johns MRN: [id number] Date of Birth: [birth year] Referring Provider (PT): Ophelia Charter   Encounter Date: 12/22/2017  PT End of Session - 12/22/17 1555    Visit Number  2    Date for PT Re-Evaluation  02/12/18    PT Start Time  1510    PT Stop Time  1555    PT Time Calculation (min)  45 min    Activity Tolerance  Patient tolerated treatment well    Behavior During Therapy  [facility] for tasks assessed/performed       Past Medical History:  Diagnosis Date  . Depression   . Diabetes mellitus without complication ([place])   . Hypertension     Past Surgical History:  Procedure Laterality Date  . BARIATRIC SURGERY    . GASTROPLASTY DUODENAL SWITCH    . KNEE SURGERY    . LAPAROSCOPIC BILATERAL SALPINGO OOPHERECTOMY      There were no vitals filed for this visit.  Subjective Assessment - 12/22/17 1511    Subjective  "Iv been doing better"    Currently in Pain?  Yes    Pain Score  3     Pain Location  Sacrum                       [facility] Adult PT Treatment/Exercise - 12/22/17 0001      Exercises   Exercises  Lumbar      Lumbar Exercises: Stretches   Double Knee to Chest Stretch  3 reps;20 seconds    Lower Trunk Rotation  3 reps;20 seconds    Other Lumbar Stretch Exercise  quadratus laborum stretch standing 3 reps 20 sec hold bilateral      Lumbar Exercises: Aerobic   Nustep  L3 7 min      Lumbar Exercises: Standing   Row  AROM;Strengthening;Both;20 reps;Theraband    Theraband Level (Row)  Level 2 (Red)    Shoulder Extension  AROM;Strengthening;Both;20 reps;Theraband    Theraband Level (Shoulder Extension)  Level 2 (Red)    Shoulder ADduction  AROM;Strengthening;Both;15 reps;Theraband   2x15   Theraband Level (Shoulder Adduction)  Level 2 (Red)    Other Standing Lumbar Exercises  Hip 3 way      Lumbar Exercises: Seated   Other Seated Lumbar Exercises  clams 2x10 red Tband      Lumbar Exercises: Supine   Bridge  Compliant;20 reps;2 seconds    Bridge with Ball Squeeze  Compliant;20 reps;3 seconds    Bridge with March  Compliant;10 reps      Lumbar Exercises: Prone   Straight Leg Raise  15 reps;2 seconds    Other Prone Lumbar Exercises  cat/cow 20 reps               PT Short Term Goals - 12/22/17 1558      PT SHORT TERM GOAL #1   Title  Ind with initial HEP    Time  2    Period  Weeks    Status  Achieved        PT Long Term Goals - 12/22/17 1558      PT LONG TERM GOAL #1   Title  decreased pain with sitting by 75% or more    Time  8    Period  Weeks  Status  Partially Met            Plan - 12/22/17 1556    Clinical Impression Statement  Pt tolerated progression of TE well without increased pain. Pt had a little harder time with hip 3 way on R as oppossed to L due to LE fatigue. Pt required min VC to correct hup compensation with hip 3 way. Pt demonstrated good body machanics with all other exercises and is progressing appropriatly at this time.     Rehab Potential  Good    PT Frequency  2x / week    PT Duration  8 weeks    PT Treatment/Interventions  ADLs/Self Care Home Management;Cryotherapy;Electrical Stimulation;Moist Heat;Traction;Therapeutic exercise;Neuromuscular re-education;Patient/family education;Manual techniques;Dry needling;Taping    PT Next Visit Plan  Continue adding exercises as pain allows    PT Home Exercise Plan  cat/cow, standing "7" stretch at sink, prone quad stretch with strap; TA contraction using pelvic floor    Consulted and Agree with Plan of Care  Patient       Patient will benefit from skilled therapeutic intervention in order to improve the following deficits and impairments:  Pain, Decreased mobility, Postural dysfunction, Decreased activity tolerance, Decreased  strength  Visit Diagnosis: Sacrococcygeal disorders, not elsewhere classified     Problem List There are no active problems to display for this patient.   Howell Rucks, SPTA 12/22/2017, 3:59 PM  [address] [address] [place], [place], [address] Phone: [phone]   Fax:  [phone]  Name: Susan Johns MRN: [id number] Date of Birth: January 03, 1966

## 2017-12-26 ENCOUNTER — Ambulatory Visit: Payer: BLUE CROSS/BLUE SHIELD | Admitting: Physical Therapy

## 2017-12-26 ENCOUNTER — Encounter: Payer: Self-pay | Admitting: Physical Therapy

## 2017-12-26 DIAGNOSIS — M533 Sacrococcygeal disorders, not elsewhere classified: Secondary | ICD-10-CM

## 2017-12-26 NOTE — Therapy (Signed)
[address] [address] [place], [place], [address] Phone: [phone]   Fax:  [phone]  Physical Therapy Treatment  Patient Details  Name: Susan Johns MRN: [id number] Date of Birth: 07-02-1965 Referring Provider (PT): Ophelia Charter   Encounter Date: 12/26/2017  PT End of Session - 12/26/17 1032    Visit Number  3    Date for PT Re-Evaluation  02/12/18    PT Start Time  1008    PT Stop Time  1053    PT Time Calculation (min)  45 min       Past Medical History:  Diagnosis Date  . Depression   . Diabetes mellitus without complication ([place])   . Hypertension     Past Surgical History:  Procedure Laterality Date  . BARIATRIC SURGERY    . GASTROPLASTY DUODENAL SWITCH    . KNEE SURGERY    . LAPAROSCOPIC BILATERAL SALPINGO OOPHERECTOMY      There were no vitals filed for this visit.  Subjective Assessment - 12/26/17 1011    Subjective  back went "out" on me last night. RT >Left    Currently in Pain?  Yes    Pain Score  7     Pain Location  Back                       [facility] Adult PT Treatment/Exercise - 12/26/17 0001      Exercises   Exercises  Lumbar      Lumbar Exercises: Aerobic   Nustep  L 5 36mn      Lumbar Exercises: Standing   Row  AROM;Strengthening;Both;Theraband;10 reps    Shoulder Extension  AROM;Strengthening;Both;Theraband;10 reps    Theraband Level (Shoulder Extension)  Level 2 (Red)    Other Standing Lumbar Exercises  red tband trunk ext 10 times      Lumbar Exercises: Seated   Other Seated Lumbar Exercises  pelvic ROM on dyna disc      Modalities   Modalities  Electrical Stimulation;Moist Heat      Moist Heat Therapy   Number Minutes Moist Heat  15 Minutes    Moist Heat Location  Lumbar Spine      Electrical Stimulation   Electrical Stimulation Location  LB    Electrical Stimulation Action  IFC    Electrical Stimulation Parameters  supine    Electrical  Stimulation Goals  Pain      Manual Therapy   Manual Therapy  Passive ROM    Passive ROM  LE and trunk               PT Short Term Goals - 12/22/17 1558      PT SHORT TERM GOAL #1   Title  Ind with initial HEP    Time  2    Period  Weeks    Status  Achieved        PT Long Term Goals - 12/22/17 1558      PT LONG TERM GOAL #1   Title  decreased pain with sitting by 75% or more    Time  8    Period  Weeks    Status  Partially Met            Plan - 12/26/17 1033    Clinical Impression Statement  pt arrived today with increased LBP stating "back went out last night" decreased mvmt. tolerated limited ther ex, PROM to LE and trunk  tolerated fine with very good flexibility. added MH and estim for pain relief.    PT Treatment/Interventions  ADLs/Self Care Home Management;Cryotherapy;Electrical Stimulation;Moist Heat;Traction;Therapeutic exercise;Neuromuscular re-education;Patient/family education;Manual techniques;Dry needling;Taping    PT Next Visit Plan  assess and progress       Patient will benefit from skilled therapeutic intervention in order to improve the following deficits and impairments:  Pain, Decreased mobility, Postural dysfunction, Decreased activity tolerance, Decreased strength  Visit Diagnosis: Sacrococcygeal disorders, not elsewhere classified     Problem List There are no active problems to display for this patient.   Leilynn Pilat,ANGIE PTA 12/26/2017, 10:35 AM  [place], [place], [address] Phone: [phone]   Fax:  [phone]  Name: Wyolene Weimann MRN: [id number] Date of Birth: November 11, 1965

## 2017-12-30 ENCOUNTER — Ambulatory Visit: Payer: BLUE CROSS/BLUE SHIELD

## 2017-12-30 ENCOUNTER — Ambulatory Visit: Payer: BLUE CROSS/BLUE SHIELD | Admitting: Physical Therapy

## 2017-12-30 DIAGNOSIS — M533 Sacrococcygeal disorders, not elsewhere classified: Secondary | ICD-10-CM

## 2017-12-30 NOTE — Therapy (Addendum)
[address] [place], [place], [address] Phone: [phone]   Fax:  [phone]  Physical Therapy Treatment  Patient Details  Name: Susan Johns MRN: [id number] Date of Birth: [birth year] Referring Provider (PT): Ophelia Charter   Encounter Date: 12/30/2017  PT End of Session - 12/30/17 1244    Visit Number  4    Date for PT Re-Evaluation  02/12/18    PT Start Time  1150    PT Stop Time  1250    PT Time Calculation (min)  60 min    Activity Tolerance  Patient tolerated treatment well;No increased pain    Behavior During Therapy  [facility] for tasks assessed/performed       Past Medical History:  Diagnosis Date  . Depression   . Diabetes mellitus without complication ([place])   . Hypertension     Past Surgical History:  Procedure Laterality Date  . BARIATRIC SURGERY    . GASTROPLASTY DUODENAL SWITCH    . KNEE SURGERY    . LAPAROSCOPIC BILATERAL SALPINGO OOPHERECTOMY      There were no vitals filed for this visit.  Subjective Assessment - 12/30/17 1154    Subjective  Pt reports her tail bone "has been barking a little bit".      Currently in Pain?  Yes    Pain Score  7     Pain Location  Coccyx    Pain Descriptors / Indicators  Aching;Dull;Sharp    Aggravating Factors   sitting; lying flat without knees bent    Pain Relieving Factors  stretches, (heat for lower back)         [facility] PT Assessment - 12/30/17 0001      Assessment   Medical Diagnosis  lumbago; coccyx pain    Referring Provider (PT)  Dax Griffin Basil    Onset Date/Surgical Date  11/06/17    Next MD Visit  after therapy    Prior Therapy  no      Palpation   SI assessment   Lt ASIS higher than Rt; Rt sacral torsion       [facility] Adult PT Treatment/Exercise - 12/30/17 0001      Self-Care   Self-Care  Other Self-Care Comments    Other Self-Care Comments   pt educated on self massage to low back and hip musculature; demo given; pt verbalized  understanding.       Lumbar Exercises: Stretches   Passive Hamstring Stretch  30 seconds;Right;Left;2 reps   supine with strap   Passive Hamstring Stretch Limitations  additional rep on LLE after bridges due to cramping    Prone on Elbows Stretch  3 reps;20 seconds    Piriformis Stretch  Left;Right;2 reps;30 seconds   supine     Lumbar Exercises: Aerobic   Nustep  --   held due to increased coccyx pain     Lumbar Exercises: Supine   Ab Set  5 reps;5 seconds    Bridge  10 reps;5 seconds      Lumbar Exercises: Prone   Opposite Arm/Leg Raise  Right arm/Left leg;Left arm/Right leg;5 reps   2 sets     Moist Heat Therapy   Number Minutes Moist Heat  15 Minutes    Moist Heat Location  Lumbar Spine      Electrical Stimulation   Electrical Stimulation Location  Rt glute and bilat coccyx    Electrical Stimulation Action  premod to each area    Dealer  Stimulation Parameters  to pt tolerance     Electrical Stimulation Goals  Pain      Manual Therapy   Manual Therapy  Muscle Energy Technique;Soft tissue mobilization    Soft tissue mobilization  STM to Rt glute, hip rotators and tissue around Rt sacral border.     Muscle Energy Technique  MET to correct ant rotated Rt ilium with contract/relax of Rt hamstring.              PT Education - 12/30/17 1307    Education Details  HEP-bridge and opposite arm/leg lifts; hamstring and piriformis stretches; self massage with ball to glutes/hip rotators in standing    Person(s) Educated  Patient    Methods  Handout;Explanation;Demonstration;Verbal cues    Comprehension  Verbalized understanding;Returned demonstration       PT Short Term Goals - 12/22/17 1558      PT SHORT TERM GOAL #1   Title  Ind with initial HEP    Time  2    Period  Weeks    Status  Achieved        PT Long Term Goals - 12/30/17 1251      PT LONG TERM GOAL #1   Title  decreased pain with sitting by 75% or more    Time  8    Period  Weeks    Status   Partially Met      PT LONG TERM GOAL #2   Title  Patient to demo negative lumbosaral instability and pain with SLR.    Time  8    Period  Weeks    Status  On-going      PT LONG TERM GOAL #3   Title  Patient able to sit for 30 min without having to bend forward.     Time  8    Period  Weeks    Status  On-going            Plan - 12/30/17 1246    Clinical Impression Statement  Pt initially observed sitting with lateral trunk flexion due to increased coccyx pain; able to sit with even weight between ischial tuberosities after exercises and manual therapy.  Pt presented with pelvis asymmetries (Rt ilium ant rotated); improved with MET correction.  Pt tolerated exercises well; will benefit from continued PT intervention to maximize functional mobility with less pain.     Rehab Potential  Good    PT Frequency  2x / week    PT Duration  8 weeks    PT Treatment/Interventions  ADLs/Self Care Home Management;Cryotherapy;Electrical Stimulation;Moist Heat;Traction;Therapeutic exercise;Neuromuscular re-education;Patient/family education;Manual techniques;Dry needling;Taping    PT Next Visit Plan  assess pelvis alignment;  add clam exercise and piriformis stretch to HEP.     Consulted and Agree with Plan of Care  Patient       Patient will benefit from skilled therapeutic intervention in order to improve the following deficits and impairments:  Pain, Decreased mobility, Postural dysfunction, Decreased activity tolerance, Decreased strength  Visit Diagnosis: Sacrococcygeal disorders, not elsewhere classified     Problem List There are no active problems to display for this patient.  Kerin Perna, PTA 12/30/17 1:09 PM  [address] [place] [place], [place], [address] Phone: [phone]   Fax:  [phone]  Name: Kamiyah Kindel MRN: [id number] Date of Birth: [birth year]

## 2018-01-01 ENCOUNTER — Ambulatory Visit: Payer: BLUE CROSS/BLUE SHIELD | Admitting: Physical Therapy

## 2018-01-01 DIAGNOSIS — M533 Sacrococcygeal disorders, not elsewhere classified: Secondary | ICD-10-CM

## 2018-01-01 NOTE — Therapy (Signed)
[address] [place], [place], [address] Phone: [phone]   Fax:  [phone]  Physical Therapy Treatment  Patient Details  Name: Susan Johns MRN: [id number] Date of Birth: 10/18/1965 Referring Provider (PT): Ophelia Charter   Encounter Date: 01/01/2018  PT End of Session - 01/01/18 1552    Visit Number  5    Date for PT Re-Evaluation  02/12/18    PT Start Time  1520    PT Stop Time  1620    PT Time Calculation (min)  60 min       Past Medical History:  Diagnosis Date  . Depression   . Diabetes mellitus without complication ([place])   . Hypertension     Past Surgical History:  Procedure Laterality Date  . BARIATRIC SURGERY    . GASTROPLASTY DUODENAL SWITCH    . KNEE SURGERY    . LAPAROSCOPIC BILATERAL SALPINGO OOPHERECTOMY      There were no vitals filed for this visit.  Subjective Assessment - 01/01/18 1515    Subjective  ordered the TENS unit. pain not as bad today    Currently in Pain?  Yes    Pain Score  3     Pain Location  Back                       [facility] Adult PT Treatment/Exercise - 01/01/18 0001      Lumbar Exercises: Aerobic   Nustep  L 4 6 min      Lumbar Exercises: Standing   Other Standing Lumbar Exercises  red tband hip ext and abd 10 each leg    Other Standing Lumbar Exercises  trunk ext red tband 15 times      Lumbar Exercises: Supine   Other Supine Lumbar Exercises  SI/core stab 20 min   left HS cramp     Modalities   Modalities  Iontophoresis      Moist Heat Therapy   Number Minutes Moist Heat  15 Minutes    Moist Heat Location  Lumbar Spine      Electrical Stimulation   Electrical Stimulation Location  RT LB/SI    Electrical Stimulation Action  IFC    Electrical Stimulation Parameters  to pt tol.    Electrical Stimulation Goals  Pain      Iontophoresis   Type of Iontophoresis  Dexamethasone    Location  RT SI    Dose  1.2 cc dex    Time  4  hour 80 mA leave on patch      Manual Therapy   Manual therapy comments  checked SI alignment at [facility] and end of ex and = but very tender to touch               PT Short Term Goals - 12/22/17 1558      PT SHORT TERM GOAL #1   Title  Ind with initial HEP    Time  2    Period  Weeks    Status  Achieved        PT Long Term Goals - 12/30/17 1251      PT LONG TERM GOAL #1   Title  decreased pain with sitting by 75% or more    Time  8    Period  Weeks    Status  Partially Met      PT LONG TERM GOAL #2   Title  Patient to demo negative lumbosaral instability and pain with SLR.    Time  8    Period  Weeks    Status  On-going      PT LONG TERM GOAL #3   Title  Patient able to sit for 30 min without having to bend forward.     Time  8    Period  Weeks    Status  On-going            Plan - 01/01/18 1553    Clinical Impression Statement  pt SI in alignmnet when arrived and maintained alignment through session. pt tolerated core stab ex well with some increased RT SI pain with ex - very tender with palpation.    PT Treatment/Interventions  ADLs/Self Care Home Management;Cryotherapy;Electrical Stimulation;Moist Heat;Traction;Therapeutic exercise;Neuromuscular re-education;Patient/family education;Manual techniques;Dry needling;Taping    PT Next Visit Plan  assess pelvis alignment;  add clam exercise and piriformis stretch to HEP.        Patient will benefit from skilled therapeutic intervention in order to improve the following deficits and impairments:  Pain, Decreased mobility, Postural dysfunction, Decreased activity tolerance, Decreased strength  Visit Diagnosis: Sacrococcygeal disorders, not elsewhere classified     Problem List There are no active problems to display for this patient.   Keithan Dileonardo,ANGIE PTA 01/01/2018, 3:55 PM  [address] [place] [address] [place], [place], [address] Phone:  [phone]   Fax:  [phone]  Name: Susan Johns MRN: [id number] Date of Birth: 06/28/1965

## 2018-01-05 ENCOUNTER — Ambulatory Visit: Payer: BLUE CROSS/BLUE SHIELD | Admitting: Physical Therapy

## 2018-01-05 ENCOUNTER — Encounter: Payer: Self-pay | Admitting: Physical Therapy

## 2018-01-05 DIAGNOSIS — M533 Sacrococcygeal disorders, not elsewhere classified: Secondary | ICD-10-CM

## 2018-01-05 NOTE — Therapy (Signed)
[address] [place], [place], [address] Phone: [phone]   Fax:  [phone]  Physical Therapy Treatment  Patient Details  Name: Susan Johns MRN: [id number] Date of Birth: Sep 07, 1965 Referring Provider (PT): Ophelia Charter   Encounter Date: 01/05/2018  PT End of Session - 01/05/18 1513    Visit Number  6    Date for PT Re-Evaluation  02/12/18    PT Start Time  6962    PT Stop Time  1522    PT Time Calculation (min)  54 min    Activity Tolerance  Patient tolerated treatment well;No increased pain    Behavior During Therapy  WFL for tasks assessed/performed       Past Medical History:  Diagnosis Date  . Depression   . Diabetes mellitus without complication ([place])   . Hypertension     Past Surgical History:  Procedure Laterality Date  . BARIATRIC SURGERY    . GASTROPLASTY DUODENAL SWITCH    . KNEE SURGERY    . LAPAROSCOPIC BILATERAL SALPINGO OOPHERECTOMY      There were no vitals filed for this visit.  Subjective Assessment - 01/05/18 1433    Subjective  Doing well overall     Currently in Pain?  Yes    Pain Score  2     Pain Location  --   hip and tail bone                      [facility] Adult PT Treatment/Exercise - 01/05/18 0001      Lumbar Exercises: Aerobic   Nustep  L 4 6 min      Lumbar Exercises: Machines for Strengthening   Cybex Knee Extension  5lb 2x10     Cybex Knee Flexion  20lb 2x10       Lumbar Exercises: Standing   Other Standing Lumbar Exercises  5lb   ext and abd 10 each leg    Other Standing Lumbar Exercises  ext 5lb 2x10       Lumbar Exercises: Seated   Sit to Stand  5 reps   x2    Other Seated Lumbar Exercises  ab sets with Pball 2x10       Lumbar Exercises: Supine   Bridge  15 reps;2 seconds;Compliant   x2   Straight Leg Raise  10 reps;2 seconds   with ab set    Other Supine Lumbar Exercises  Le on pball bridges,K2C, oblq      Moist Heat  Therapy   Number Minutes Moist Heat  15 Minutes    Moist Heat Location  Lumbar Spine      Electrical Stimulation   Electrical Stimulation Location  RT LB/SI    Electrical Stimulation Action  IFC    Electrical Stimulation Parameters  to pt tol.    Electrical Stimulation Goals  Pain               PT Short Term Goals - 12/22/17 1558      PT SHORT TERM GOAL #1   Title  Ind with initial HEP    Time  2    Period  Weeks    Status  Achieved        PT Long Term Goals - 12/30/17 1251      PT LONG TERM GOAL #1   Title  decreased pain with sitting by 75% or more    Time  8  Period  Weeks    Status  Partially Met      PT LONG TERM GOAL #2   Title  Patient to demo negative lumbosaral instability and pain with SLR.    Time  8    Period  Weeks    Status  On-going      PT LONG TERM GOAL #3   Title  Patient able to sit for 30 min without having to bend forward.     Time  8    Period  Weeks    Status  On-going            Plan - 01/05/18 1513    Clinical Impression Statement  Pt reports that's this has been one of her better days. She tolerated a progressed therapy session that incorporated both isolate core and functional strength twell. Reported a strong pull in her R low back with LE on Pball with K2C    PT Frequency  2x / week    PT Duration  8 weeks    PT Treatment/Interventions  ADLs/Self Care Home Management;Cryotherapy;Electrical Stimulation;Moist Heat;Traction;Therapeutic exercise;Neuromuscular re-education;Patient/family education;Manual techniques;Dry needling;Taping    PT Next Visit Plan  Core and hip strengthening    PT Home Exercise Plan  piriformis stretch       Patient will benefit from skilled therapeutic intervention in order to improve the following deficits and impairments:  Pain, Decreased mobility, Postural dysfunction, Decreased activity tolerance, Decreased strength  Visit Diagnosis: Sacrococcygeal disorders, not elsewhere  classified     Problem List There are no active problems to display for this patient.   Scot Jun, PTA 01/05/2018, 3:17 PM  [address] [place], [place], [address] Phone: [phone]   Fax:  [phone]  Name: Nakoma Gotwalt MRN: [id number] Date of Birth: [birth year]

## 2018-01-08 ENCOUNTER — Ambulatory Visit: Payer: BLUE CROSS/BLUE SHIELD | Admitting: Physical Therapy

## 2018-01-08 DIAGNOSIS — M533 Sacrococcygeal disorders, not elsewhere classified: Secondary | ICD-10-CM

## 2018-01-08 NOTE — Patient Instructions (Signed)

## 2018-01-08 NOTE — Therapy (Signed)
[address] [place], [place], [address] Phone: [phone]   Fax:  [phone]  Physical Therapy Treatment  Patient Details  Name: Cecely Rengel MRN: [id number] Date of Birth: 09/29/1965 Referring Provider (PT): Ophelia Charter   Encounter Date: 01/08/2018  PT End of Session - 01/08/18 1044    Visit Number  7    Date for PT Re-Evaluation  02/12/18    PT Start Time  1005    PT Stop Time  1055    PT Time Calculation (min)  50 min       Past Medical History:  Diagnosis Date  . Depression   . Diabetes mellitus without complication ([place])   . Hypertension     Past Surgical History:  Procedure Laterality Date  . BARIATRIC SURGERY    . GASTROPLASTY DUODENAL SWITCH    . KNEE SURGERY    . LAPAROSCOPIC BILATERAL SALPINGO OOPHERECTOMY      There were no vitals filed for this visit.  Subjective Assessment - 01/08/18 1011    Subjective  tried but getting better. tailbone hurt occassionally    Currently in Pain?  No/denies                       [facility] Adult PT Treatment/Exercise - 01/08/18 0001      Lumbar Exercises: Aerobic   Nustep  L 5 7 min      Lumbar Exercises: Machines for Strengthening   Cybex Knee Extension  5lb 2x12    Cybex Knee Flexion  20lb 2x15    Leg Press  30# 2 sets 15   calf raises 30# 2 sets 15     Lumbar Exercises: Standing   Other Standing Lumbar Exercises  step up 6 inch fwd with opp leg ext    Other Standing Lumbar Exercises  step up lat with opp hip abd 10 each      Lumbar Exercises: Supine   Bridge  15 reps;2 seconds;Compliant   feet on ball   Bridge with [employer]  15 reps;2 seconds    Bridge with clamshell  15 reps;2 seconds   green tband, plus marching   Straight Leg Raise  10 reps;2 seconds   with abd and green tband     Moist Heat Therapy   Number Minutes Moist Heat  10 Minutes    Moist Heat Location  Lumbar Spine      Manual Therapy   Manual  Therapy  Soft tissue mobilization    Manual therapy comments  very tender with trigger pts noted    Soft tissue mobilization  RT SI               PT Short Term Goals - 12/22/17 1558      PT SHORT TERM GOAL #1   Title  Ind with initial HEP    Time  2    Period  Weeks    Status  Achieved        PT Long Term Goals - 01/08/18 1044      PT LONG TERM GOAL #1   Title  decreased pain with sitting by 75% or more    Status  Partially Met      PT LONG TERM GOAL #2   Title  Patient to demo negative lumbosaral instability and pain with SLR.    Status  Partially Met      PT LONG TERM GOAL #3  Title  Patient able to sit for 30 min without having to bend forward.     Status  Partially Met            Plan - 01/08/18 1045    Clinical Impression Statement  pt tolerated increased ther ex today for SI stab. trigger pts in RT SI - very isolated. progressing with goals and pt noting improvements with home activities    PT Treatment/Interventions  ADLs/Self Care Home Management;Cryotherapy;Electrical Stimulation;Moist Heat;Traction;Therapeutic exercise;Neuromuscular re-education;Patient/family education;Manual techniques;Dry needling;Taping    PT Next Visit Plan  Core and hip strengthening       Patient will benefit from skilled therapeutic intervention in order to improve the following deficits and impairments:  Pain, Decreased mobility, Postural dysfunction, Decreased activity tolerance, Decreased strength  Visit Diagnosis: Sacrococcygeal disorders, not elsewhere classified     Problem List There are no active problems to display for this patient.   Rosary Filosa,ANGIE PTA 01/08/2018, 10:46 AM  [place], [place], [address] Phone: [phone]   Fax:  [phone]  Name: Aubreanna Percle MRN: [id number] Date of Birth: 1965-08-30

## 2018-01-12 ENCOUNTER — Ambulatory Visit: Payer: BLUE CROSS/BLUE SHIELD | Admitting: Physical Therapy

## 2018-01-12 ENCOUNTER — Encounter: Payer: Self-pay | Admitting: Physical Therapy

## 2018-01-12 DIAGNOSIS — M533 Sacrococcygeal disorders, not elsewhere classified: Secondary | ICD-10-CM

## 2018-01-12 NOTE — Therapy (Signed)
[address] [place], [place], [address] Phone: [phone]   Fax:  [phone]  Physical Therapy Treatment  Patient Details  Name: Susan Johns MRN: [id number] Date of Birth: 06-20-1965 Referring Provider (PT): Ophelia Charter   Encounter Date: 01/12/2018  PT End of Session - 01/12/18 1506    Visit Number  8    Date for PT Re-Evaluation  02/12/18    PT Start Time  1430    PT Stop Time  1521    PT Time Calculation (min)  51 min    Activity Tolerance  Patient tolerated treatment well;No increased pain    Behavior During Therapy  WFL for tasks assessed/performed       Past Medical History:  Diagnosis Date  . Depression   . Diabetes mellitus without complication ([place])   . Hypertension     Past Surgical History:  Procedure Laterality Date  . BARIATRIC SURGERY    . GASTROPLASTY DUODENAL SWITCH    . KNEE SURGERY    . LAPAROSCOPIC BILATERAL SALPINGO OOPHERECTOMY      There were no vitals filed for this visit.  Subjective Assessment - 01/12/18 1433    Subjective  Pretty good but the R hip has been stiffening up    Currently in Pain?  No/denies    Pain Score  0-No pain                       [facility] Adult PT Treatment/Exercise - 01/12/18 0001      Lumbar Exercises: Aerobic   Nustep  L 5 7 min      Lumbar Exercises: Machines for Strengthening   Cybex Knee Extension  5lb 2x12    Cybex Knee Flexion  20lb 2x15    Leg Press  30# 2 sets 15; heel raises 30lb 2x15       Lumbar Exercises: Standing   Other Standing Lumbar Exercises  step up 6 inch opposit leg hip and knee fflex     Other Standing Lumbar Exercises  step up lat with opp hip and knee flex x10       Lumbar Exercises: Seated   Sit to Stand  20 reps   from blue chair holding yellow ball      Lumbar Exercises: Supine   Bridge  15 reps;2 seconds;Compliant    Other Supine Lumbar Exercises  LE on pball bridges,K2C, oblq      Moist Heat Therapy   Number Minutes Moist Heat  15 Minutes    Moist Heat Location  Lumbar Spine               PT Short Term Goals - 12/22/17 1558      PT SHORT TERM GOAL #1   Title  Ind with initial HEP    Time  2    Period  Weeks    Status  Achieved        PT Long Term Goals - 01/08/18 1044      PT LONG TERM GOAL #1   Title  decreased pain with sitting by 75% or more    Status  Partially Met      PT LONG TERM GOAL #2   Title  Patient to demo negative lumbosaral instability and pain with SLR.    Status  Partially Met      PT LONG TERM GOAL #3   Title  Patient able to sit for 30 min without  having to bend forward.     Status  Partially Met            Plan - 01/12/18 1508    Clinical Impression Statement  Pt continues to do well with all of her exercises. Good strength and ROM. Improvement with doing her ADLS at home. Continues to have some tail bone tenderness at times    Rehab Potential  Good    PT Frequency  2x / week    PT Duration  8 weeks    PT Treatment/Interventions  ADLs/Self Care Home Management;Cryotherapy;Electrical Stimulation;Moist Heat;Traction;Therapeutic exercise;Neuromuscular re-education;Patient/family education;Manual techniques;Dry needling;Taping    PT Next Visit Plan  Core and hip strengthening       Patient will benefit from skilled therapeutic intervention in order to improve the following deficits and impairments:  Pain, Decreased mobility, Postural dysfunction, Decreased activity tolerance, Decreased strength  Visit Diagnosis: Sacrococcygeal disorders, not elsewhere classified     Problem List There are no active problems to display for this patient.   Scot Jun, PTA 01/12/2018, 3:11 PM  [place] [address] [place], [place], [address] Phone: [phone]   Fax:  [phone]  Name: Susan Johns MRN: [id number] Date of Birth: 26-Jun-1965

## 2018-01-15 ENCOUNTER — Encounter: Payer: Self-pay | Admitting: Physical Therapy

## 2018-01-15 ENCOUNTER — Ambulatory Visit: Payer: BLUE CROSS/BLUE SHIELD | Attending: Orthopaedic Surgery | Admitting: Physical Therapy

## 2018-01-15 DIAGNOSIS — M533 Sacrococcygeal disorders, not elsewhere classified: Secondary | ICD-10-CM

## 2018-01-15 NOTE — Therapy (Signed)
[address] [place], [place], [address] Phone: [phone]   Fax:  [phone]  Physical Therapy Treatment  Patient Details  Name: Susan Johns MRN: [id number] Date of Birth: 1965/12/10 Referring Provider (PT): Ophelia Charter   Encounter Date: 01/15/2018  PT End of Session - 01/15/18 1450    Visit Number  9    PT Start Time  1430    PT Stop Time  1450    PT Time Calculation (min)  20 min    Activity Tolerance  Patient limited by pain    Behavior During Therapy  [facility] for tasks assessed/performed       Past Medical History:  Diagnosis Date  . Depression   . Diabetes mellitus without complication ([place])   . Hypertension     Past Surgical History:  Procedure Laterality Date  . BARIATRIC SURGERY    . GASTROPLASTY DUODENAL SWITCH    . KNEE SURGERY    . LAPAROSCOPIC BILATERAL SALPINGO OOPHERECTOMY      There were no vitals filed for this visit.  Subjective Assessment - 01/15/18 1434    Subjective  Pt reports that she has some scar tissue from a surgery in 1994, Pt reports that she has a knot about 4 inches below her navel that has swollen causing some pain.     Currently in Pain?  Yes    Pain Score  3    7-8/10 with movement                      [facility] Adult PT Treatment/Exercise - 01/15/18 0001      Lumbar Exercises: Aerobic   Recumbent Bike  L1 x 3 min       Lumbar Exercises: Standing   Shoulder Extension  Power Tower;20 reps;Strengthening   5lb   Other Standing Lumbar Exercises  hip abd & ext red tband 2x10 each     Other Standing Lumbar Exercises  6in step up x10                PT Short Term Goals - 12/22/17 1558      PT SHORT TERM GOAL #1   Title  Ind with initial HEP    Time  2    Period  Weeks    Status  Achieved        PT Long Term Goals - 01/08/18 1044      PT LONG TERM GOAL #1   Title  decreased pain with sitting by 75% or more    Status  Partially  Met      PT LONG TERM GOAL #2   Title  Patient to demo negative lumbosaral instability and pain with SLR.    Status  Partially Met      PT LONG TERM GOAL #3   Title  Patient able to sit for 30 min without having to bend forward.     Status  Partially Met            Plan - 01/15/18 1450    Clinical Impression Statement  Pt enters clinic reporting a shorted session due to having pain from a previous surgery. She did well with the few interventions. Step ups raised the pain slightly.     Rehab Potential  Good    PT Frequency  2x / week    PT Duration  8 weeks    PT Treatment/Interventions  ADLs/Self Care Home Management;Cryotherapy;Electrical Stimulation;Moist  Heat;Traction;Therapeutic exercise;Neuromuscular re-education;Patient/family education;Manual techniques;Dry needling;Taping    PT Next Visit Plan  Assess Tx core and hip strengthening.       Patient will benefit from skilled therapeutic intervention in order to improve the following deficits and impairments:  Pain, Decreased mobility, Postural dysfunction, Decreased activity tolerance, Decreased strength  Visit Diagnosis: Sacrococcygeal disorders, not elsewhere classified     Problem List There are no active problems to display for this patient.   Scot Jun, PTA 01/15/2018, 2:53 PM  [place], [place], [address] Phone: [phone]   Fax:  [phone]  Name: Livvy Spilman MRN: [id number] Date of Birth: 1965-09-27

## 2018-01-19 ENCOUNTER — Ambulatory Visit: Payer: BLUE CROSS/BLUE SHIELD | Admitting: Physical Therapy

## 2018-01-20 ENCOUNTER — Ambulatory Visit: Payer: BLUE CROSS/BLUE SHIELD | Admitting: Physical Therapy

## 2018-01-20 DIAGNOSIS — M533 Sacrococcygeal disorders, not elsewhere classified: Secondary | ICD-10-CM | POA: Diagnosis not present

## 2018-01-20 NOTE — Therapy (Signed)
[address] [place], [place], [address] Phone: [phone]   Fax:  [phone] Progress Note Reporting Period 12/18/2017 to 01/20/18 for the first 10 visits   See note below for Objective Data and Assessment of Progress/Goals.      Physical Therapy Treatment  Patient Details  Name: Susan Johns MRN: [id number] Date of Birth: 1965/11/29 Referring Provider (PT): Ophelia Charter   Encounter Date: 01/20/2018  PT End of Session - 01/20/18 0951    Visit Number  10    Date for PT Re-Evaluation  02/12/18    PT Start Time  0925    PT Stop Time  0955    PT Time Calculation (min)  30 min       Past Medical History:  Diagnosis Date  . Depression   . Diabetes mellitus without complication ([place])   . Hypertension     Past Surgical History:  Procedure Laterality Date  . BARIATRIC SURGERY    . GASTROPLASTY DUODENAL SWITCH    . KNEE SURGERY    . LAPAROSCOPIC BILATERAL SALPINGO OOPHERECTOMY      There were no vitals filed for this visit.  Subjective Assessment - 01/20/18 0928    Subjective  problem last time was diagnosised yesterday as subcutaneous cyst that they popped and I am packing. feeling run down and tired lately. problem I am here for is better overall 60-70% better. hip pain comes and goes    Currently in Pain?  Yes    Pain Score  2     Pain Location  Back                       [facility] Adult PT Treatment/Exercise - 01/20/18 0001      Lumbar Exercises: Aerobic   Nustep  L 5 6 min      Lumbar Exercises: Machines for Strengthening   Cybex Knee Extension  5lb 2x15    Cybex Knee Flexion  20lb 2x15    Leg Press  40# 2 sets 15   calf raises 40# 2 sets 15     Lumbar Exercises: Standing   Shoulder Extension  Power Tower;20 reps;Strengthening   and rows   Other Standing Lumbar Exercises  6in step up x15                PT Short Term Goals - 12/22/17 1558      PT SHORT TERM  GOAL #1   Title  Ind with initial HEP    Time  2    Period  Weeks    Status  Achieved        PT Long Term Goals - 01/20/18 3762      PT LONG TERM GOAL #1   Title  decreased pain with sitting by 75% or more    Baseline  50-60%    Status  Partially Met      PT LONG TERM GOAL #2   Title  Patient to demo negative lumbosaral instability and pain with SLR.    Status  Partially Met      PT LONG TERM GOAL #3   Title  Patient able to sit for 30 min without having to bend forward.     Baseline  better but still needed occassionally    Status  Partially Met            Plan - 01/20/18 0953    Clinical Impression Statement  pt tolerated all ther ex well, no pain just fatigue. declined need for any modalities. progressing with goals    PT Treatment/Interventions  ADLs/Self Care Home Management;Cryotherapy;Electrical Stimulation;Moist Heat;Traction;Therapeutic exercise;Neuromuscular re-education;Patient/family education;Manual techniques;Dry needling;Taping    PT Next Visit Plan  Tx core and hip strengthening.       Patient will benefit from skilled therapeutic intervention in order to improve the following deficits and impairments:  Pain, Decreased mobility, Postural dysfunction, Decreased activity tolerance, Decreased strength  Visit Diagnosis: Sacrococcygeal disorders, not elsewhere classified     Problem List There are no active problems to display for this patient.   PAYSEUR,ANGIE PTA 01/20/2018, 9:54 AM  [place], [place], [address] Phone: [phone]   Fax:  [phone]  Name: Arlina Sabina MRN: [id number] Date of Birth: [birth year]

## 2018-01-22 ENCOUNTER — Encounter: Payer: Self-pay | Admitting: Physical Therapy

## 2018-01-22 ENCOUNTER — Ambulatory Visit: Payer: BLUE CROSS/BLUE SHIELD | Admitting: Physical Therapy

## 2018-01-22 DIAGNOSIS — M533 Sacrococcygeal disorders, not elsewhere classified: Secondary | ICD-10-CM

## 2018-01-22 NOTE — Therapy (Signed)
[place], [place], [address] Phone: [phone]   Fax:  [phone]  Physical Therapy Treatment  Patient Details  Name: Susan Johns MRN: [id number] Date of Birth: [birth year] Referring Provider (PT): Ophelia Charter   Encounter Date: 01/22/2018  PT End of Session - 01/22/18 1547    Visit Number  11    Date for PT Re-Evaluation  02/12/18    PT Start Time  3295    PT Stop Time  1602    PT Time Calculation (min)  47 min    Activity Tolerance  Patient tolerated treatment well    Behavior During Therapy  [facility] for tasks assessed/performed       Past Medical History:  Diagnosis Date  . Depression   . Diabetes mellitus without complication ([place])   . Hypertension     Past Surgical History:  Procedure Laterality Date  . BARIATRIC SURGERY    . GASTROPLASTY DUODENAL SWITCH    . KNEE SURGERY    . LAPAROSCOPIC BILATERAL SALPINGO OOPHERECTOMY      There were no vitals filed for this visit.  Subjective Assessment - 01/22/18 1523    Subjective  Pt reports a little twinge in her R glute.     Currently in Pain?  Yes    Pain Score  4     Pain Location  Buttocks                       [facility] Adult PT Treatment/Exercise - 01/22/18 0001      Lumbar Exercises: Aerobic   Nustep  L 5 6 min      Lumbar Exercises: Machines for Strengthening   Cybex Knee Extension  10lb 2x10    Cybex Knee Flexion  20lb 2x15    Leg Press  40# 2 sets 15      Lumbar Exercises: Standing   Row  20 reps;Power tower;Strengthening   Rev grip, 20lb    Shoulder Extension  Power Tower;20 reps;Strengthening   5lb   Other Standing Lumbar Exercises  Hip abd & ext 5lb x10     Other Standing Lumbar Exercises  6in lat step up x15       Lumbar Exercises: Seated   Sit to Stand  20 reps   yellow ball, blue chair      Moist Heat Therapy   Number Minutes Moist Heat  15 Minutes    Moist Heat Location  Lumbar Spine      Electrical Stimulation   Electrical Stimulation Location  RT LB/SI    Electrical Stimulation Action  IFC    Electrical Stimulation Parameters  Supine    Electrical Stimulation Goals  Pain               PT Short Term Goals - 12/22/17 1558      PT SHORT TERM GOAL #1   Title  Ind with initial HEP    Time  2    Period  Weeks    Status  Achieved        PT Long Term Goals - 01/22/18 1551      PT LONG TERM GOAL #1   Title  decreased pain with sitting by 75% or more    Status  Partially Met            Plan - 01/22/18 1550    Clinical Impression Statement  She continues to progress with goals, Some  pain entering clinic but no increase with the exercises. She does report some fatigue with activity.     Rehab Potential  Good    PT Frequency  2x / week    PT Duration  8 weeks    PT Treatment/Interventions  ADLs/Self Care Home Management;Cryotherapy;Electrical Stimulation;Moist Heat;Traction;Therapeutic exercise;Neuromuscular re-education;Patient/family education;Manual techniques;Dry needling;Taping    PT Next Visit Plan  Tx core and hip strengthening.       Patient will benefit from skilled therapeutic intervention in order to improve the following deficits and impairments:  Pain, Decreased mobility, Postural dysfunction, Decreased activity tolerance, Decreased strength  Visit Diagnosis: Sacrococcygeal disorders, not elsewhere classified     Problem List There are no active problems to display for this patient.   Scot Jun, PTA 01/22/2018, 3:51 PM  [place], [place], [address] Phone: [phone]   Fax:  [phone]  Name: Susan Johns MRN: [id number] Date of Birth: [birth year]

## 2018-01-26 ENCOUNTER — Ambulatory Visit: Payer: BLUE CROSS/BLUE SHIELD | Admitting: Physical Therapy

## 2018-01-27 ENCOUNTER — Ambulatory Visit: Payer: BLUE CROSS/BLUE SHIELD | Admitting: Physical Therapy

## 2018-01-27 DIAGNOSIS — M533 Sacrococcygeal disorders, not elsewhere classified: Secondary | ICD-10-CM

## 2018-01-27 NOTE — Therapy (Signed)
[place], [place], [address] Phone: [phone]   Fax:  [phone]  Physical Therapy Treatment  Patient Details  Name: Susan Johns MRN: [id number] Date of Birth: 28-Oct-1965 Referring Provider (PT): Ophelia Charter   Encounter Date: 01/27/2018  PT End of Session - 01/27/18 1523    Visit Number  12    Date for PT Re-Evaluation  02/12/18    PT Start Time  3149    PT Stop Time  1525    PT Time Calculation (min)  40 min       Past Medical History:  Diagnosis Date  . Depression   . Diabetes mellitus without complication ([place])   . Hypertension     Past Surgical History:  Procedure Laterality Date  . BARIATRIC SURGERY    . GASTROPLASTY DUODENAL SWITCH    . KNEE SURGERY    . LAPAROSCOPIC BILATERAL SALPINGO OOPHERECTOMY      There were no vitals filed for this visit.  Subjective Assessment - 01/27/18 1451    Subjective  RT hip hurting today and catches with getting up and down    Currently in Pain?  Yes    Pain Score  8     Pain Location  Hip    Pain Orientation  Right                       [facility] Adult PT Treatment/Exercise - 01/27/18 0001      Lumbar Exercises: Aerobic   Nustep  L 5 6 min      Modalities   Modalities  Ultrasound      Ultrasound   Ultrasound Location  RT SI    Ultrasound Parameters  1.3 w/cm 2 100% cont    Ultrasound Goals  Pain      Manual Therapy   Manual Therapy  Soft tissue mobilization    Soft tissue mobilization  RT SI and RT IT stripping    Passive ROM  RT hip and IT             PT Education - 01/27/18 1523    Education Details  use of TENS on SI and ITB    Person(s) Educated  Patient    Methods  Explanation    Comprehension  Verbalized understanding       PT Short Term Goals - 12/22/17 1558      PT SHORT TERM GOAL #1   Title  Ind with initial HEP    Time  2    Period  Weeks    Status  Achieved        PT Long Term  Goals - 01/22/18 1551      PT LONG TERM GOAL #1   Title  decreased pain with sitting by 75% or more    Status  Partially Met            Plan - 01/27/18 1523    Clinical Impression Statement  very tight and trigger points in RT IT- very painful with stripping and STW but felt better after, discussed using TENS at home    PT Treatment/Interventions  ADLs/Self Care Home Management;Cryotherapy;Electrical Stimulation;Moist Heat;Traction;Therapeutic exercise;Neuromuscular re-education;Patient/family education;Manual techniques;Dry needling;Taping    PT Next Visit Plan  asses pain and progress       Patient will benefit from skilled therapeutic intervention in order to improve the following deficits and impairments:  Pain, Decreased mobility, Postural dysfunction, Decreased  activity tolerance, Decreased strength  Visit Diagnosis: Sacrococcygeal disorders, not elsewhere classified     Problem List There are no active problems to display for this patient.   [name],ANGIE PTA 01/27/2018, 3:25 PM  [place], [place], [address] Phone: [phone]   Fax:  [phone]  Name: Susan Johns MRN: [id number] Date of Birth: 07-Oct-1965

## 2018-01-29 ENCOUNTER — Ambulatory Visit: Payer: BLUE CROSS/BLUE SHIELD | Admitting: Physical Therapy

## 2018-01-30 ENCOUNTER — Ambulatory Visit: Payer: BLUE CROSS/BLUE SHIELD | Admitting: Physical Therapy

## 2018-01-30 DIAGNOSIS — M533 Sacrococcygeal disorders, not elsewhere classified: Secondary | ICD-10-CM

## 2018-01-30 NOTE — Therapy (Signed)
[place], [place], [address] Phone: [phone]   Fax:  [phone]  Physical Therapy Treatment  Patient Details  Name: Susan Johns MRN: [id number] Date of Birth: 09-01-1965 Referring Provider (PT): Ophelia Charter   Encounter Date: 01/30/2018  PT End of Session - 01/30/18 0802    Visit Number  13    Date for PT Re-Evaluation  02/12/18    PT Start Time  0759    PT Stop Time  0844    PT Time Calculation (min)  45 min    Activity Tolerance  Patient tolerated treatment well    Behavior During Therapy  [facility] for tasks assessed/performed       Past Medical History:  Diagnosis Date  . Depression   . Diabetes mellitus without complication ([place])   . Hypertension     Past Surgical History:  Procedure Laterality Date  . BARIATRIC SURGERY    . GASTROPLASTY DUODENAL SWITCH    . KNEE SURGERY    . LAPAROSCOPIC BILATERAL SALPINGO OOPHERECTOMY      There were no vitals filed for this visit.  Subjective Assessment - 01/30/18 0802    Subjective  Patient felt better the rest of the day after last session but pain returned the next day.     Pertinent History  R knee bil meniscus repair, bariatric surgery, HTN, depression, R elbow fx    How long can you sit comfortably?  minutes    Diagnostic tests  xrays back and hips negative    Patient Stated Goals  get rid of pain    Currently in Pain?  Yes    Pain Score  6     Pain Location  Hip    Pain Orientation  Right    Pain Descriptors / Indicators  Aching;Dull;Sharp    Pain Type  Chronic pain                       [facility] Adult PT Treatment/Exercise - 01/30/18 0001      Lumbar Exercises: Stretches   Figure 4 Stretch  2 reps;30 seconds;With overpressure   right     Ultrasound   Ultrasound Location  right piriformis/glut    Ultrasound Parameters  1.5 w/cm2 1 mhz cont x 10 min    Ultrasound Goals  Pain      Manual Therapy   Manual  Therapy  Soft tissue mobilization;Myofascial release    Soft tissue mobilization  to right quads and HS and TFL    Myofascial Release  to right ITB       Trigger Point Dry Needling - 01/30/18 0846    Consent Given?  Yes    Muscles Treated Lower Body  Gluteus minimus;Gluteus maximus;Piriformis;Quadriceps    Gluteus Maximus Response  Twitch response elicited;Palpable increased muscle length    Gluteus Minimus Response  Twitch response elicited;Palpable increased muscle length    Piriformis Response  Twitch response elicited;Palpable increased muscle length    Quadriceps Response  Twitch response elicited;Palpable increased muscle length           PT Education - 01/30/18 1248    Education Details  HEP    Person(s) Educated  Patient    Methods  Explanation;Demonstration;Handout    Comprehension  Verbalized understanding;Returned demonstration       PT Short Term Goals - 12/22/17 1558      PT SHORT TERM GOAL #1   Title  Ind  with initial HEP    Time  2    Period  Weeks    Status  Achieved        PT Long Term Goals - 01/30/18 0804      PT LONG TERM GOAL #1   Title  decreased pain with sitting by 75% or more    Baseline  70-75% improved    Time  8    Period  Weeks    Status  Partially Met      PT LONG TERM GOAL #2   Title  Patient to demo negative lumbosaral instability and pain with SLR.    Time  8    Period  Weeks    Status  Achieved      PT LONG TERM GOAL #3   Title  Patient able to sit for 30 min without having to bend forward.     Time  8    Period  Weeks    Status  Achieved            Plan - 01/30/18 1248    Clinical Impression Statement  Patient responded very well to DN in lateral quads and hip today. She has met 2 more LTGs.    Rehab Potential  Good    PT Frequency  2x / week    PT Duration  8 weeks    PT Treatment/Interventions  ADLs/Self Care Home Management;Cryotherapy;Electrical Stimulation;Moist Heat;Traction;Therapeutic  exercise;Neuromuscular re-education;Patient/family education;Manual techniques;Dry needling;Taping    PT Next Visit Plan  asses pain and progress; continue to work quads/ITB as needed.    Consulted and Agree with Plan of Care  Patient       Patient will benefit from skilled therapeutic intervention in order to improve the following deficits and impairments:  Pain, Decreased mobility, Postural dysfunction, Decreased activity tolerance, Decreased strength  Visit Diagnosis: Sacrococcygeal disorders, not elsewhere classified     Problem List There are no active problems to display for this patient.  Madelyn Flavors PT 01/30/2018, 12:51 PM  [place] [address] [place], [place], [address] Phone: [phone]   Fax:  [phone]  Name: Susan Johns MRN: [id number] Date of Birth: [birth year]

## 2018-01-30 NOTE — Patient Instructions (Signed)
Piriformis (Supine)  Cross legs, right on top. Gently pull other knee toward chest until stretch is felt in buttock/hip of top leg. Hold _30-60___ seconds. Repeat __3_ times per set. Do ____ sets per session. Do _1-2__ sessions per day.  Hip Stretch  Put right ankle over left knee. Let right knee fall downward, but keep ankle in place. Feel the stretch in hip. May push down gently with hand to feel stretch. Hold ____ seconds while counting out loud. Repeat with other leg. Repeat ____ times. Do ____ sessions per day.   Solon Palm, PT 01/30/18 8:44 AM

## 2018-02-02 ENCOUNTER — Encounter: Payer: Self-pay | Admitting: Physical Therapy

## 2018-02-02 ENCOUNTER — Ambulatory Visit: Payer: BLUE CROSS/BLUE SHIELD | Admitting: Physical Therapy

## 2018-02-02 DIAGNOSIS — M533 Sacrococcygeal disorders, not elsewhere classified: Secondary | ICD-10-CM

## 2018-02-02 NOTE — Therapy (Signed)
[place], [place], [address] Phone: [phone]   Fax:  [phone]  Physical Therapy Treatment  Patient Details  Name: Susan Johns MRN: [id number] Date of Birth: 07-08-1965 Referring Provider (PT): Ophelia Charter   Encounter Date: 02/02/2018  PT End of Session - 02/02/18 0957    Visit Number  14    Date for PT Re-Evaluation  02/12/18    PT Start Time  0930    PT Stop Time  1000    PT Time Calculation (min)  30 min    Activity Tolerance  Patient tolerated treatment well    Behavior During Therapy  [facility] for tasks assessed/performed       Past Medical History:  Diagnosis Date  . Depression   . Diabetes mellitus without complication ([place])   . Hypertension     Past Surgical History:  Procedure Laterality Date  . BARIATRIC SURGERY    . GASTROPLASTY DUODENAL SWITCH    . KNEE SURGERY    . LAPAROSCOPIC BILATERAL SALPINGO OOPHERECTOMY      There were no vitals filed for this visit.  Subjective Assessment - 02/02/18 0930    Subjective  Pt reports that she has been taking an anti inflammatory that has really helped.     Pertinent History  R knee bil meniscus repair, bariatric surgery, HTN, depression, R elbow fx    Currently in Pain?  Yes    Pain Score  3     Pain Location  Hip    Pain Orientation  Right                       [facility] Adult PT Treatment/Exercise - 02/02/18 0001      Lumbar Exercises: Aerobic   Nustep  L 5 6 min      Ultrasound   Ultrasound Location  Right Priformis/ glute    Ultrasound Parameters  1Mhz 1.3w/cm2     Ultrasound Goals  Pain      Manual Therapy   Manual Therapy  Soft tissue mobilization;Myofascial release    Soft tissue mobilization  RT SI and RT IT stripping               PT Short Term Goals - 12/22/17 1558      PT SHORT TERM GOAL #1   Title  Ind with initial HEP    Time  2    Period  Weeks    Status  Achieved        PT  Long Term Goals - 01/30/18 7035      PT LONG TERM GOAL #1   Title  decreased pain with sitting by 75% or more    Baseline  70-75% improved    Time  8    Period  Weeks    Status  Partially Met      PT LONG TERM GOAL #2   Title  Patient to demo negative lumbosaral instability and pain with SLR.    Time  8    Period  Weeks    Status  Achieved      PT LONG TERM GOAL #3   Title  Patient able to sit for 30 min without having to bend forward.     Time  8    Period  Weeks    Status  Achieved            Plan - 02/02/18 0093  Clinical Impression Statement  STM to R IT band was tender, knots felt throughtout band. Positive response to [place] in R glite/poiriformis/.     Rehab Potential  Good    PT Frequency  2x / week    PT Duration  8 weeks    PT Treatment/Interventions  ADLs/Self Care Home Management;Cryotherapy;Electrical Stimulation;Moist Heat;Traction;Therapeutic exercise;Neuromuscular re-education;Patient/family education;Manual techniques;Dry needling;Taping    PT Next Visit Plan  asses pain and progress; continue to work quads/ITB as needed.       Patient will benefit from skilled therapeutic intervention in order to improve the following deficits and impairments:  Pain, Decreased mobility, Postural dysfunction, Decreased activity tolerance, Decreased strength  Visit Diagnosis: Sacrococcygeal disorders, not elsewhere classified     Problem List There are no active problems to display for this patient.   Scot Jun, PTA 02/02/2018, 10:01 AM  [place] [address] [address] [place], [place], [address] Phone: [phone]   Fax:  [phone]  Name: Brinn Westby MRN: [id number] Date of Birth: 10-06-1965

## 2018-02-05 ENCOUNTER — Ambulatory Visit: Payer: BLUE CROSS/BLUE SHIELD | Admitting: Physical Therapy

## 2018-02-05 ENCOUNTER — Encounter: Payer: Self-pay | Admitting: Physical Therapy

## 2018-02-05 DIAGNOSIS — M533 Sacrococcygeal disorders, not elsewhere classified: Secondary | ICD-10-CM

## 2018-02-05 NOTE — Therapy (Signed)
[place], [place], [address] Phone: [phone]   Fax:  [phone]  Physical Therapy Treatment  Patient Details  Name: Susan Johns MRN: [id number] Date of Birth: 1965-05-09 Referring Provider (PT): Ophelia Charter   Encounter Date: 02/05/2018  PT End of Session - 02/05/18 0955    Visit Number  15    Date for PT Re-Evaluation  02/12/18    PT Start Time  0930    PT Stop Time  1005    PT Time Calculation (min)  35 min       Past Medical History:  Diagnosis Date  . Depression   . Diabetes mellitus without complication ([place])   . Hypertension     Past Surgical History:  Procedure Laterality Date  . BARIATRIC SURGERY    . GASTROPLASTY DUODENAL SWITCH    . KNEE SURGERY    . LAPAROSCOPIC BILATERAL SALPINGO OOPHERECTOMY      There were no vitals filed for this visit.  Subjective Assessment - 02/05/18 0952    Subjective  amb in with limp. " I am frustrated with ups and downs'    Currently in Pain?  Yes    Pain Score  7     Pain Location  Back    Pain Orientation  Right                       [facility] Adult PT Treatment/Exercise - 02/05/18 0001      Moist Heat Therapy   Number Minutes Moist Heat  15 Minutes    Moist Heat Location  Lumbar Spine;Hip      Electrical Stimulation   Electrical Stimulation Location  RT glut/hip    Electrical Stimulation Action  IFC    Electrical Stimulation Parameters  SL    Electrical Stimulation Goals  Pain      Ultrasound   Ultrasound Location  RT piriformis    Ultrasound Parameters  1 mHz 1.3 w/cm2 100%    Ultrasound Goals  Pain             PT Education - 02/05/18 0957    Education Details  reviewed core stab ex and stretches    Person(s) Educated  Patient    Methods  Explanation    Comprehension  Verbalized understanding       PT Short Term Goals - 12/22/17 1558      PT SHORT TERM GOAL #1   Title  Ind with initial HEP    Time  2    Period  Weeks    Status  Achieved        PT Long Term Goals - 02/05/18 0955      PT LONG TERM GOAL #1   Title  decreased pain with sitting by 75% or more    Status  Partially Met      PT LONG TERM GOAL #2   Title  Patient to demo negative lumbosaral instability and pain with SLR.    Status  Achieved      PT LONG TERM GOAL #3   Title  Patient able to sit for 30 min without having to bend forward.     Status  Achieved            Plan - 02/05/18 0955    Clinical Impression Statement  goals met except pain that comes and goes. pt is getting very frustrated. she verb ITB is tight "  but that is not even the reason I was coming"pt gets temporary relief from modalities    PT Treatment/Interventions  ADLs/Self Care Home Management;Cryotherapy;Electrical Stimulation;Moist Heat;Traction;Therapeutic exercise;Neuromuscular re-education;Patient/family education;Manual techniques;Dry needling;Taping    PT Next Visit Plan  HOLD as pt is returning to MD d/t continued pain       Patient will benefit from skilled therapeutic intervention in order to improve the following deficits and impairments:  Pain, Decreased mobility, Postural dysfunction, Decreased activity tolerance, Decreased strength  Visit Diagnosis: Sacrococcygeal disorders, not elsewhere classified     Problem List There are no active problems to display for this patient.   PAYSEUR,ANGIE PTA 02/05/2018, 9:58 AM  [place], [place], [address] Phone: [phone]   Fax:  [phone]  Name: Susan Johns MRN: [id number] Date of Birth: [birth year]

## 2018-04-26 IMAGING — CR DG ELBOW COMPLETE 3+V*R*
4 series · 4 of 4 positions shown · non-contrast
Comparison: None.

CLINICAL DATA: Right elbow injury due to a fall today. Pain.
Initial encounter.

EXAM:
RIGHT ELBOW - COMPLETE 3+ VIEW

[x elbow joint ap right]
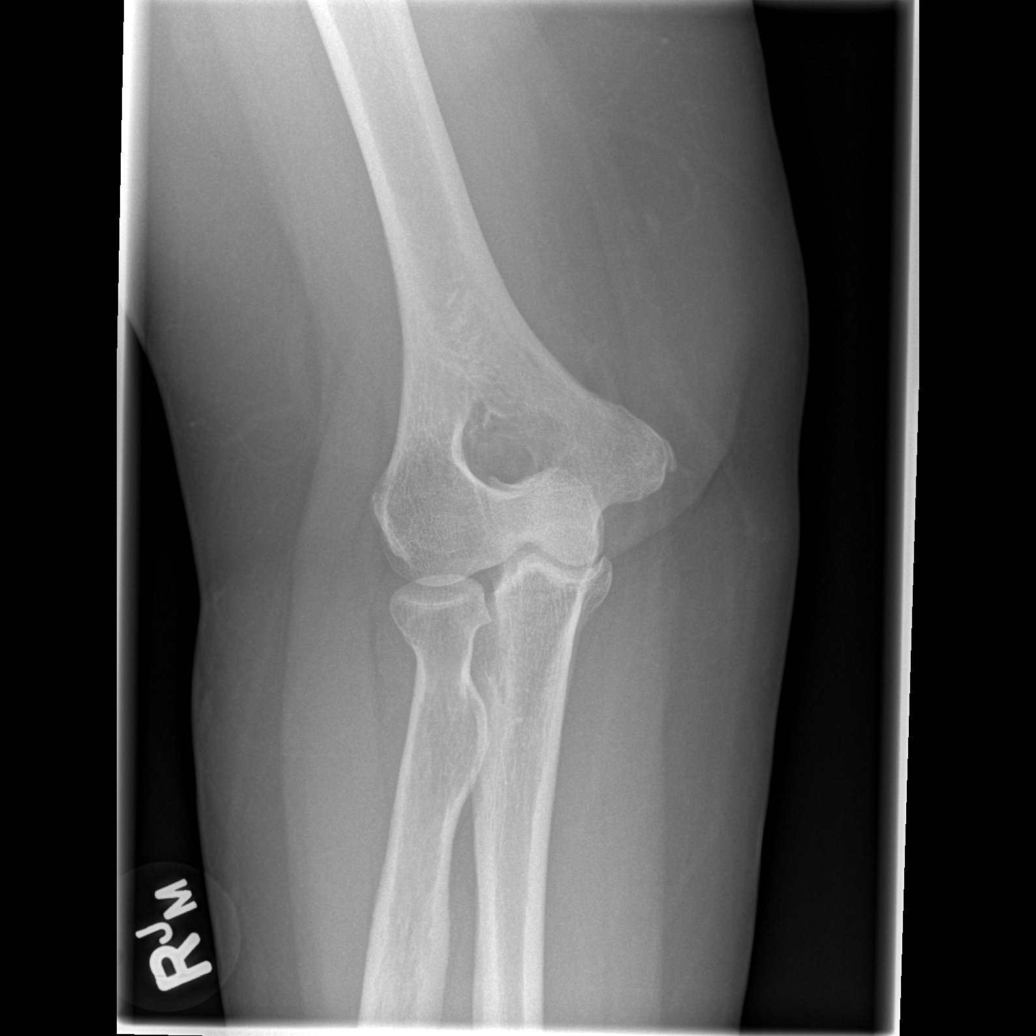

[x elbow joint obl. right (1 of 2)]
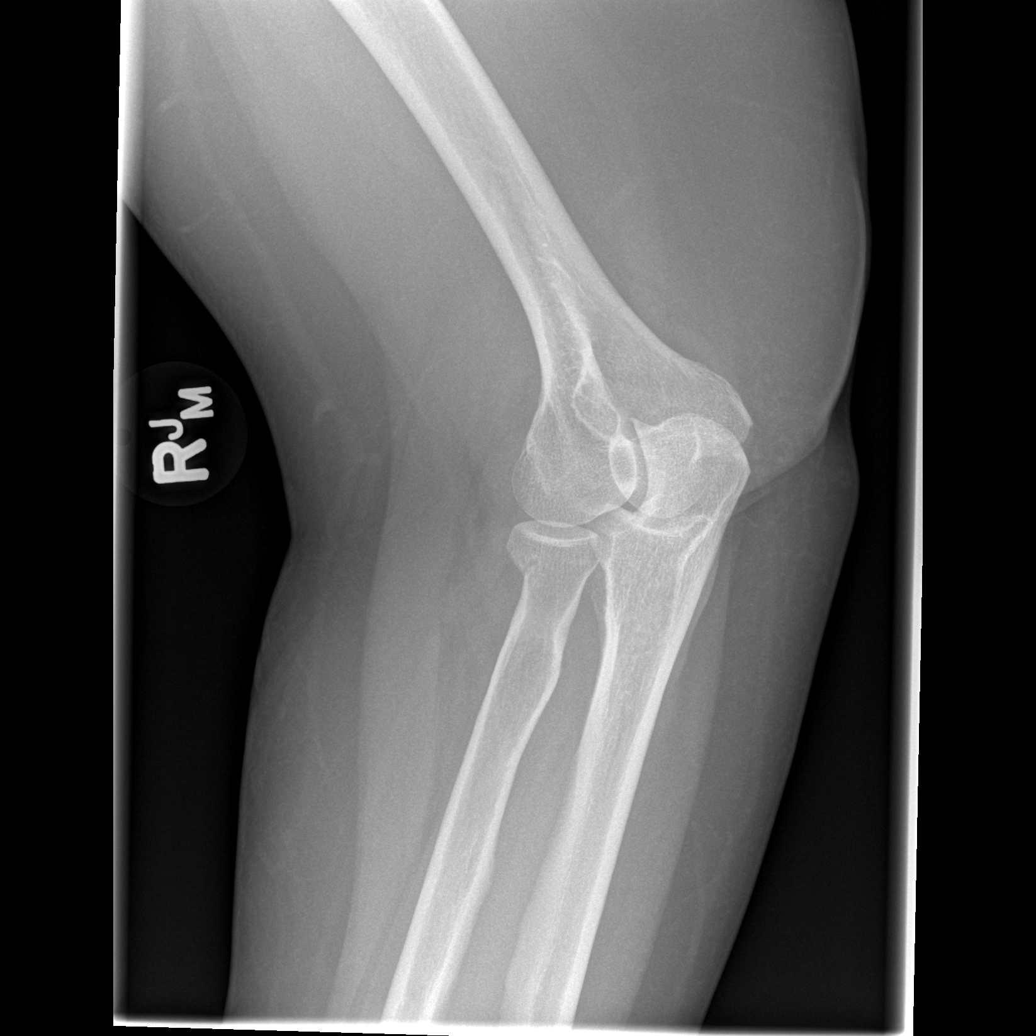

[x elbow joint obl. right (2 of 2)]
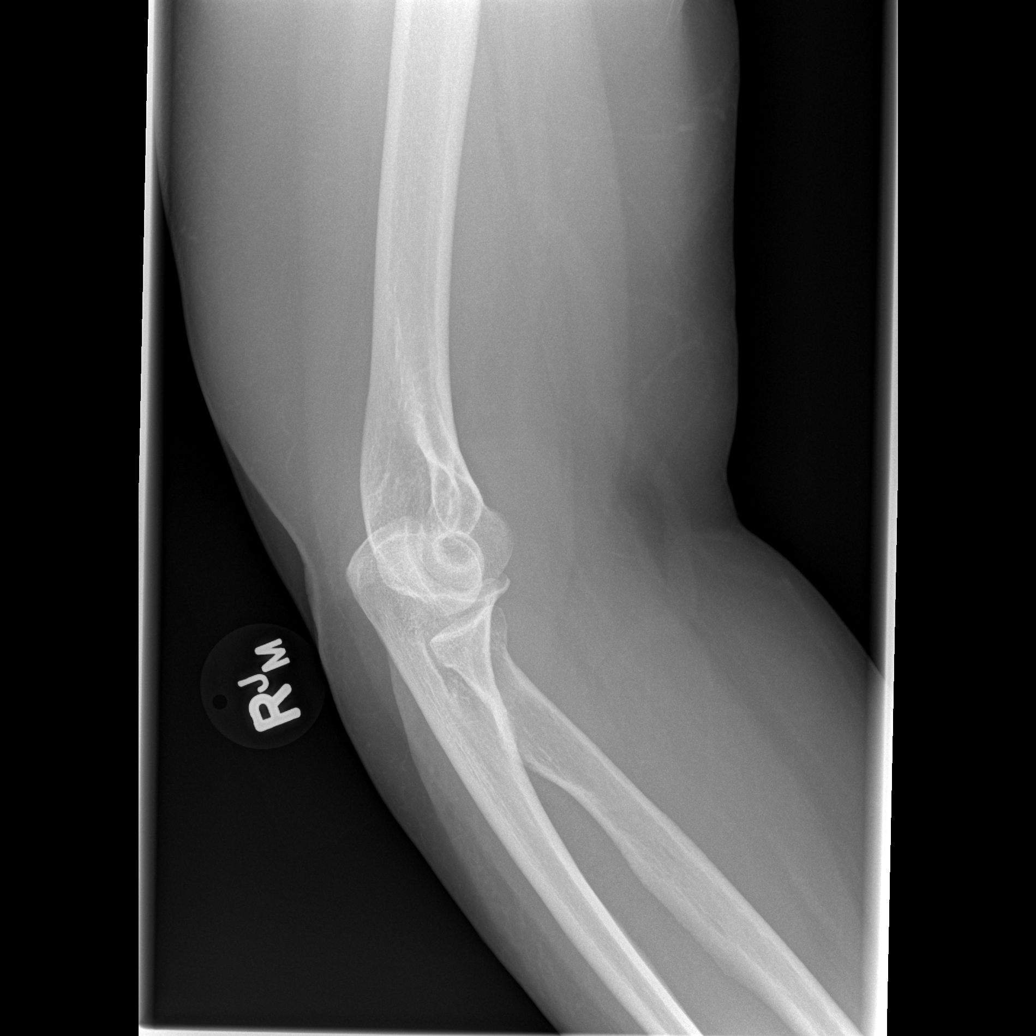

[x elbow joint lat right]
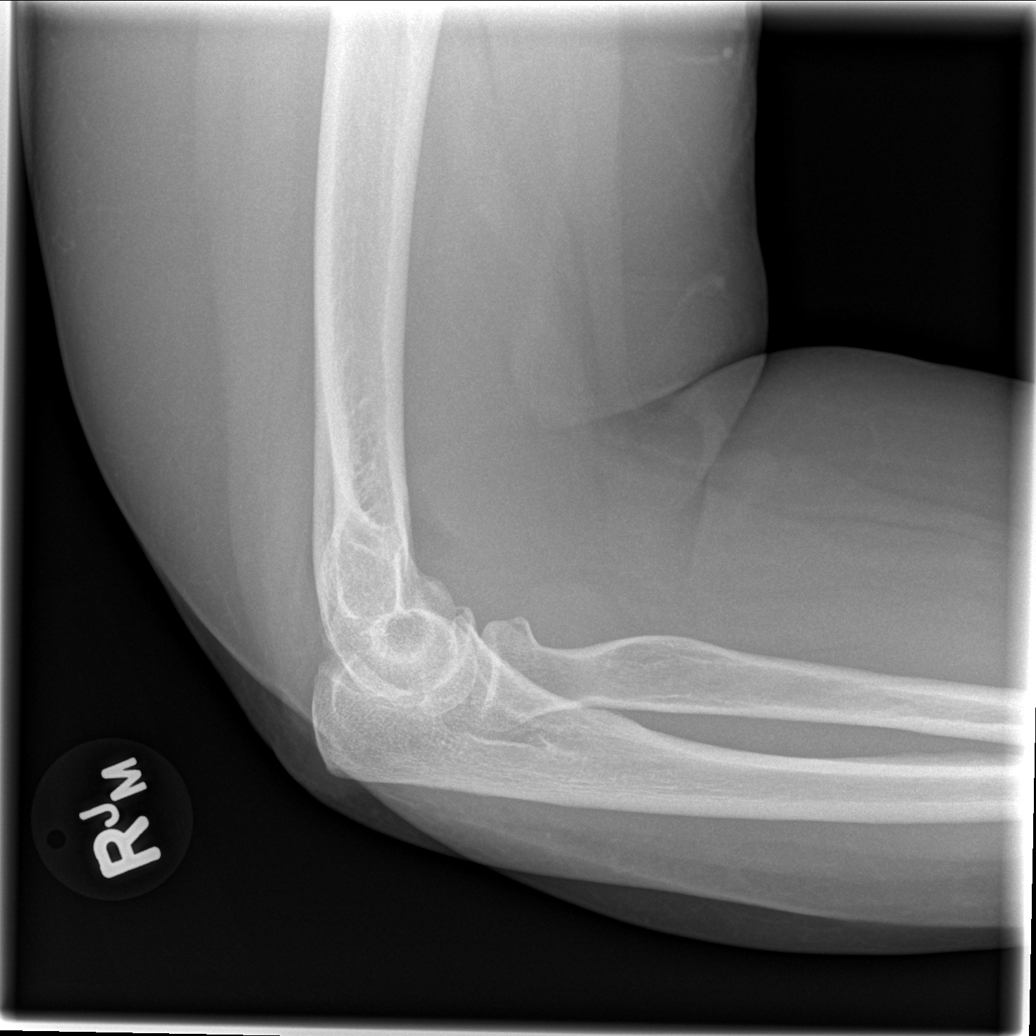

[4 of 4 positions shown; findings below may reference images not displayed]

FINDINGS: The patient has a nondisplaced fracture of the right radial neck. No
other fracture is identified. Associated joint effusion is noted.
The elbow is located. Mild spurring off the medial epicondyle
incidentally noted.
IMPRESSION: Nondisplaced fracture of the radial neck with associated joint
effusion.
